# Patient Record
Sex: Female | Born: 1945 | Race: White | Hispanic: No | Marital: Married | Smoking: Never smoker
Health system: Southern US, Community
[De-identification: ages and names within clinical notes are randomized; demographics above are authoritative.]

## PROBLEM LIST (undated history)

## (undated) HISTORY — PX: PARTIAL HYSTERECTOMY: SHX80

---

## 2018-05-11 ENCOUNTER — Encounter (HOSPITAL_COMMUNITY): Payer: Self-pay

## 2018-05-11 ENCOUNTER — Inpatient Hospital Stay (HOSPITAL_COMMUNITY)
Admission: EM | Admit: 2018-05-11 | Discharge: 2018-05-17 | DRG: 871 | Disposition: A | Discharge status: Home Health | Payer: Medicare Other | Attending: Internal Medicine | Admitting: Internal Medicine

## 2018-05-11 ENCOUNTER — Emergency Department (HOSPITAL_COMMUNITY): Payer: Medicare Other

## 2018-05-11 ENCOUNTER — Other Ambulatory Visit: Payer: Self-pay

## 2018-05-11 DIAGNOSIS — B9620 Unspecified Escherichia coli [E. coli] as the cause of diseases classified elsewhere: Secondary | ICD-10-CM | POA: Diagnosis present

## 2018-05-11 DIAGNOSIS — Z90721 Acquired absence of ovaries, unilateral: Secondary | ICD-10-CM

## 2018-05-11 DIAGNOSIS — E876 Hypokalemia: Secondary | ICD-10-CM | POA: Diagnosis present

## 2018-05-11 DIAGNOSIS — R188 Other ascites: Secondary | ICD-10-CM

## 2018-05-11 DIAGNOSIS — A419 Sepsis, unspecified organism: Secondary | ICD-10-CM | POA: Diagnosis present

## 2018-05-11 DIAGNOSIS — Z9889 Other specified postprocedural states: Secondary | ICD-10-CM | POA: Diagnosis not present

## 2018-05-11 DIAGNOSIS — R6520 Severe sepsis without septic shock: Secondary | ICD-10-CM

## 2018-05-11 DIAGNOSIS — J9811 Atelectasis: Secondary | ICD-10-CM | POA: Diagnosis present

## 2018-05-11 DIAGNOSIS — K37 Unspecified appendicitis: Secondary | ICD-10-CM | POA: Diagnosis present

## 2018-05-11 DIAGNOSIS — K3580 Unspecified acute appendicitis: Secondary | ICD-10-CM

## 2018-05-11 DIAGNOSIS — E871 Hypo-osmolality and hyponatremia: Secondary | ICD-10-CM | POA: Diagnosis present

## 2018-05-11 DIAGNOSIS — I480 Paroxysmal atrial fibrillation: Secondary | ICD-10-CM | POA: Diagnosis present

## 2018-05-11 DIAGNOSIS — N179 Acute kidney failure, unspecified: Secondary | ICD-10-CM | POA: Diagnosis present

## 2018-05-11 DIAGNOSIS — I10 Essential (primary) hypertension: Secondary | ICD-10-CM | POA: Diagnosis present

## 2018-05-11 DIAGNOSIS — Z8249 Family history of ischemic heart disease and other diseases of the circulatory system: Secondary | ICD-10-CM

## 2018-05-11 DIAGNOSIS — I4891 Unspecified atrial fibrillation: Secondary | ICD-10-CM | POA: Diagnosis present

## 2018-05-11 DIAGNOSIS — Z90711 Acquired absence of uterus with remaining cervical stump: Secondary | ICD-10-CM | POA: Diagnosis not present

## 2018-05-11 DIAGNOSIS — E878 Other disorders of electrolyte and fluid balance, not elsewhere classified: Secondary | ICD-10-CM | POA: Diagnosis present

## 2018-05-11 DIAGNOSIS — K3532 Acute appendicitis with perforation and localized peritonitis, without abscess: Secondary | ICD-10-CM | POA: Diagnosis not present

## 2018-05-11 DIAGNOSIS — K5900 Constipation, unspecified: Secondary | ICD-10-CM | POA: Diagnosis present

## 2018-05-11 DIAGNOSIS — B966 Bacteroides fragilis [B. fragilis] as the cause of diseases classified elsewhere: Secondary | ICD-10-CM | POA: Diagnosis present

## 2018-05-11 DIAGNOSIS — E860 Dehydration: Secondary | ICD-10-CM | POA: Diagnosis present

## 2018-05-11 DIAGNOSIS — K651 Peritoneal abscess: Secondary | ICD-10-CM

## 2018-05-11 DIAGNOSIS — Z882 Allergy status to sulfonamides status: Secondary | ICD-10-CM | POA: Diagnosis not present

## 2018-05-11 DIAGNOSIS — I361 Nonrheumatic tricuspid (valve) insufficiency: Secondary | ICD-10-CM | POA: Diagnosis not present

## 2018-05-11 DIAGNOSIS — K3533 Acute appendicitis with perforation and localized peritonitis, with abscess: Secondary | ICD-10-CM | POA: Diagnosis present

## 2018-05-11 LAB — MAGNESIUM: Magnesium: 2.7 mg/dL — ABNORMAL HIGH (ref 1.7–2.4)

## 2018-05-11 LAB — LIPASE, BLOOD: Lipase: 36 U/L (ref 11–51)

## 2018-05-11 LAB — COMPREHENSIVE METABOLIC PANEL WITH GFR
ALT: 76 U/L — ABNORMAL HIGH (ref 0–44)
AST: 26 U/L (ref 15–41)
Albumin: 2.8 g/dL — ABNORMAL LOW (ref 3.5–5.0)
Alkaline Phosphatase: 238 U/L — ABNORMAL HIGH (ref 38–126)
Anion gap: 17 — ABNORMAL HIGH (ref 5–15)
BUN: 73 mg/dL — ABNORMAL HIGH (ref 8–23)
CO2: 22 mmol/L (ref 22–32)
Calcium: 8.6 mg/dL — ABNORMAL LOW (ref 8.9–10.3)
Chloride: 93 mmol/L — ABNORMAL LOW (ref 98–111)
Creatinine, Ser: 2.53 mg/dL — ABNORMAL HIGH (ref 0.44–1.00)
GFR calc Af Amer: 21 mL/min — ABNORMAL LOW
GFR calc non Af Amer: 18 mL/min — ABNORMAL LOW
Glucose, Bld: 115 mg/dL — ABNORMAL HIGH (ref 70–99)
Potassium: 3.3 mmol/L — ABNORMAL LOW (ref 3.5–5.1)
Sodium: 132 mmol/L — ABNORMAL LOW (ref 135–145)
Total Bilirubin: 0.9 mg/dL (ref 0.3–1.2)
Total Protein: 7.1 g/dL (ref 6.5–8.1)

## 2018-05-11 LAB — URINALYSIS, ROUTINE W REFLEX MICROSCOPIC
BILIRUBIN URINE: NEGATIVE
Glucose, UA: NEGATIVE mg/dL
Ketones, ur: NEGATIVE mg/dL
Nitrite: NEGATIVE
Protein, ur: 30 mg/dL — AB
RBC / HPF: >50 RBC/hpf — ABNORMAL HIGH (ref 0–5)
Specific Gravity, Urine: 1.017 (ref 1.005–1.030)
WBC, UA: >50 WBC/hpf — ABNORMAL HIGH (ref 0–5)
pH: 5 (ref 5.0–8.0)

## 2018-05-11 LAB — I-STAT CREATININE, ED: Creatinine, Ser: 2.7 mg/dL — ABNORMAL HIGH (ref 0.44–1.00)

## 2018-05-11 LAB — OSMOLALITY, URINE: Osmolality, Ur: 334 mOsm/kg (ref 300–900)

## 2018-05-11 LAB — SODIUM, URINE, RANDOM: Sodium, Ur: 12 mmol/L

## 2018-05-11 LAB — CBC
HCT: 40.1 % (ref 36.0–46.0)
Hemoglobin: 13 g/dL (ref 12.0–15.0)
MCH: 30.4 pg (ref 26.0–34.0)
MCHC: 32.4 g/dL (ref 30.0–36.0)
MCV: 93.7 fL (ref 80.0–100.0)
Platelets: 290 10*3/uL (ref 150–400)
RBC: 4.28 MIL/uL (ref 3.87–5.11)
RDW: 13.4 % (ref 11.5–15.5)
WBC: 27.8 10*3/uL — ABNORMAL HIGH (ref 4.0–10.5)
nRBC: 0 % (ref 0.0–0.2)

## 2018-05-11 LAB — LACTIC ACID, PLASMA: LACTIC ACID, VENOUS: 0.9 mmol/L (ref 0.5–1.9)

## 2018-05-11 LAB — CREATININE, URINE, RANDOM: Creatinine, Urine: 242.39 mg/dL

## 2018-05-11 MED ORDER — DILTIAZEM HCL 100 MG IV SOLR
5.0000 mg/h | INTRAVENOUS | Status: DC
  Administered 2018-05-11 – 2018-05-12 (×2): 5 mg/h via INTRAVENOUS
  Filled 2018-05-11 (×2): qty 100

## 2018-05-11 MED ORDER — PIPERACILLIN-TAZOBACTAM 3.375 G IVPB 30 MIN
3.3750 g | Freq: Once | INTRAVENOUS | Status: AC
  Administered 2018-05-11: 3.375 g via INTRAVENOUS
  Filled 2018-05-11: qty 50

## 2018-05-11 MED ORDER — SODIUM CHLORIDE 0.9 % IV BOLUS
1000.0000 mL | Freq: Once | INTRAVENOUS | Status: AC
  Administered 2018-05-11: 1000 mL via INTRAVENOUS

## 2018-05-11 MED ORDER — SODIUM CHLORIDE 0.9 % IV BOLUS
500.0000 mL | Freq: Once | INTRAVENOUS | Status: AC
  Administered 2018-05-11: 500 mL via INTRAVENOUS

## 2018-05-11 MED ORDER — MORPHINE SULFATE (PF) 4 MG/ML IV SOLN
4.0000 mg | Freq: Once | INTRAVENOUS | Status: AC
  Administered 2018-05-11: 4 mg via INTRAVENOUS
  Filled 2018-05-11: qty 1

## 2018-05-11 MED ORDER — DILTIAZEM LOAD VIA INFUSION
20.0000 mg | Freq: Once | INTRAVENOUS | Status: AC
  Administered 2018-05-11: 20 mg via INTRAVENOUS
  Filled 2018-05-11: qty 20

## 2018-05-11 MED ORDER — ONDANSETRON HCL 4 MG/2ML IJ SOLN
4.0000 mg | Freq: Once | INTRAMUSCULAR | Status: AC
  Administered 2018-05-11: 4 mg via INTRAVENOUS
  Filled 2018-05-11: qty 2

## 2018-05-11 MED ORDER — POTASSIUM CHLORIDE 10 MEQ/100ML IV SOLN
10.0000 meq | INTRAVENOUS | Status: AC
  Administered 2018-05-11 (×2): 10 meq via INTRAVENOUS
  Filled 2018-05-11 (×2): qty 100

## 2018-05-11 MED ORDER — SODIUM CHLORIDE 0.9% FLUSH
3.0000 mL | Freq: Once | INTRAVENOUS | Status: AC
  Administered 2018-05-16: 3 mL via INTRAVENOUS

## 2018-05-11 NOTE — ED Provider Notes (Signed)
[facility]-EMERGENCY DEPT Provider Note   CSN: [id number] Arrival date & time: 05/11/18  1624    History   Chief Complaint Chief Complaint  Patient presents with   Abdominal Pain   Constipation    HPI Monique Castillo is a 73 y.o. female.     73 yo F with a chief complaint of right lower quadrant abdominal pain.  Going on for about 5 or 6 days now.  No fevers.  Has been constipated with this.  A few episodes of vomiting but generally has been able to tolerate p.o.  Worse with ambulation.  Better is when she lies back flat.  Has a prior right-sided oophrectomy.  Denies urinary symptoms.  Denies flank pain.  Denies issues with eating.  The history is provided by the patient.  Abdominal Pain  Pain location:  RLQ Pain quality: sharp and shooting   Pain radiates to:  Does not radiate Pain severity:  Moderate Onset quality:  Gradual Duration:  5 days Timing:  Constant Progression:  Worsening Chronicity:  New Relieved by:  Nothing Worsened by:  Palpation (ambulation) Ineffective treatments:  None tried Associated symptoms: constipation, nausea and vomiting   Associated symptoms: no chest pain, no chills, no dysuria, no fever and no shortness of breath   Constipation  Associated symptoms: abdominal pain, nausea and vomiting   Associated symptoms: no dysuria and no fever     History reviewed. No pertinent past medical history.  Patient Active Problem List   Diagnosis Date Noted   Appendicitis 05/11/2018   Hyponatremia 05/11/2018   Hypokalemia 05/11/2018   Dehydration 05/11/2018   Sepsis (HCC) 05/11/2018   Intraabdominal fluid collection 05/11/2018   AKI (acute kidney injury) (HCC) 05/11/2018   Atrial fibrillation with RVR (HCC) 05/11/2018    Past Surgical History:  Procedure Laterality Date   PARTIAL HYSTERECTOMY       OB History   No obstetric history on file.      Home Medications    Prior to Admission medications   Medication  Sig Start Date End Date Taking? Authorizing Provider  ibuprofen (ADVIL,MOTRIN) 200 MG tablet Take 400 mg by mouth every 6 (six) hours as needed for moderate pain.   Yes [provider]    Family History Family History  Problem Relation Age of Onset   Seizures Father    Ulcers Father    Heart failure Father     Social History Social History   Tobacco Use   Smoking status: Never Smoker   Smokeless tobacco: Never Used  Substance Use Topics   Alcohol use: Yes    Comment: 1 glass wine daily   Drug use: Never     Allergies   Sulfa antibiotics   Review of Systems Review of Systems  Constitutional: Negative for chills and fever.  HENT: Negative for congestion and rhinorrhea.   Eyes: Negative for redness and visual disturbance.  Respiratory: Negative for shortness of breath and wheezing.   Cardiovascular: Negative for chest pain and palpitations.  Gastrointestinal: Positive for abdominal pain, constipation, nausea and vomiting.  Genitourinary: Negative for dysuria and urgency.  Musculoskeletal: Negative for arthralgias and myalgias.  Skin: Negative for pallor and wound.  Neurological: Negative for dizziness and headaches.     Physical Exam Updated Vital Signs BP 114/77    Pulse (!) 127    Temp 98.1 F (36.7 C) (Oral)    Resp 20    Ht  (1.676 m)    Wt 74.8 kg  SpO2 93%    BMI 26.63 kg/m   Physical Exam Vitals signs and nursing note reviewed.  Constitutional:      General: She is not in acute distress.    Appearance: She is well-developed. She is not diaphoretic.  HENT:     Head: Normocephalic and atraumatic.  Eyes:     Pupils: Pupils are equal, round, and reactive to light.  Neck:     Musculoskeletal: Normal range of motion and neck supple.  Cardiovascular:     Rate and Rhythm: Normal rate and regular rhythm.     Heart sounds: No murmur. No friction rub. No gallop.   Pulmonary:     Effort: Pulmonary effort is normal.     Breath sounds: No  wheezing or rales.  Abdominal:     General: There is no distension.     Palpations: Abdomen is soft.     Tenderness: There is abdominal tenderness ( Focally tender to the right lower quadrant.  Negative Rovsing's negative obturator negative psoas).  Musculoskeletal:        General: No tenderness.  Skin:    General: Skin is warm and dry.  Neurological:     Mental Status: She is alert and oriented to person, place, and time.  Psychiatric:        Behavior: Behavior normal.      ED Treatments / Results  Labs (all labs ordered are listed, but only abnormal results are displayed) Labs Reviewed  COMPREHENSIVE METABOLIC PANEL - Abnormal; Notable for the following components:      Result Value   Sodium 132 (*)    Potassium 3.3 (*)    Chloride 93 (*)    Glucose, Bld 115 (*)    BUN 73 (*)    Creatinine, Ser 2.53 (*)    Calcium 8.6 (*)    Albumin 2.8 (*)    ALT 76 (*)    Alkaline Phosphatase 238 (*)    GFR calc non Af Amer 18 (*)    GFR calc Af Amer 21 (*)    Anion gap 17 (*)    All other components within normal limits  CBC - Abnormal; Notable for the following components:   WBC 27.8 (*)    All other components within normal limits  URINALYSIS, ROUTINE W REFLEX MICROSCOPIC - Abnormal; Notable for the following components:   Color, Urine AMBER (*)    APPearance CLOUDY (*)    Hgb urine dipstick MODERATE (*)    Protein, ur 30 (*)    Leukocytes,Ua LARGE (*)    RBC / HPF >50 (*)    WBC, UA >50 (*)    Bacteria, UA FEW (*)    Non Squamous Epithelial 6-10 (*)    All other components within normal limits  MAGNESIUM - Abnormal; Notable for the following components:   Magnesium 2.7 (*)    All other components within normal limits  I-STAT CREATININE, ED - Abnormal; Notable for the following components:   Creatinine, Ser 2.70 (*)    All other components within normal limits  LIPASE, BLOOD  LACTIC ACID, PLASMA  LACTIC ACID, PLASMA  SODIUM, URINE, RANDOM  OSMOLALITY, URINE    CREATININE, URINE, RANDOM    EKG EKG Interpretation  Date/Time:  Wednesday May 11 2018 19:44:13 EDT Ventricular Rate:  138 PR Interval:    QRS Duration: 104 QT Interval:  285 QTC Calculation: 432 R Axis:   62 Text Interpretation:  Atrial fibrillation Nonspecific repol abnormality, diffuse leads No old tracing  to compare Confirmed by Melene Plan [phone]) on 05/11/2018 7:52:10 PM   Radiology Ct Abdomen Pelvis Wo Contrast  Result Date: 05/11/2018 CLINICAL DATA:  Right lower abdominal pain with constipation and emesis EXAM: CT ABDOMEN AND PELVIS WITHOUT CONTRAST TECHNIQUE: Multidetector CT imaging of the abdomen and pelvis was performed following the standard protocol without IV contrast. COMPARISON:  None. FINDINGS: Lower chest: Lung bases demonstrate atelectasis at the right middle lobe and right base. Mild bronchiectasis at the right middle lobe and right base. Trace pericardial effusion. Heart size upper normal. Hepatobiliary: Calcified granuloma. No calcified gallstone or biliary dilatation Pancreas: Unremarkable. No pancreatic ductal dilatation or surrounding inflammatory changes. Spleen: Calcified granuloma Adrenals/Urinary Tract: Adrenal glands are within normal limits. Dilated left extrarenal pelvis versus parapelvic cysts. No hydroureter. No ureteral stone. Bladder is normal Stomach/Bowel: Stomach is nonenlarged. No dilated small bowel. Enlarged appendix measuring up to 15 mm in size with multiple appendicoliths. Significant right lower quadrant inflammatory changes. Vascular/Lymphatic: Nonaneurysmal aorta. Moderate aortic atherosclerosis. No significantly enlarged lymph nodes Reproductive: Uterine calcification likely a fibroid. No adnexal mass Other: Small foci of extraluminal gas in the right lower quadrant. Gas and fluid collection inferior to the liver measures 7.4 x 5.2 x 3.4 cm. Contiguous with this is an additional right lower quadrant fluid collection measuring 5 x 3.1 x 9 cm.  Smaller fluid collections anterior to the transverse colon measuring up to 2.1 cm. Musculoskeletal: Degenerative changes. Grade 1 anterolisthesis L4 on L5. No acute osseous abnormality IMPRESSION: 1. Findings consistent with acute perforated appendicitis. Small extraluminal gas locules in the right lower quadrant. Multiple gas and fluid collections extending from the inferior right hepatic lobe to the right lower quadrant consistent with intra-abdominal abscess. Appendix: Location: Right lower quadrant Diameter: 15 mm Appendicolith: At least 3 appendicoliths Mucosal hyper-enhancement: No contrast given Extraluminal gas: Positive Periappendiceal collection: Positive Critical Value/emergent results were called by telephone at the time of interpretation on 05/11/2018 at 7:09 pm to Dr. Melene Plan , who verbally acknowledged these results. Electronically Signed   By: Jasmine Pang M.D.   On: 05/11/2018 19:09    Procedures Procedures (including critical care time)  Medications Ordered in ED Medications  sodium chloride flush (NS) 0.9 % injection 3 mL (3 mLs Intravenous Not Given 05/11/18 1910)  diltiazem (CARDIZEM) 1 mg/mL load via infusion 20 mg (20 mg Intravenous Bolus from Bag 05/11/18 2048)    And  diltiazem (CARDIZEM) 100 mg in dextrose 5 % 100 mL (1 mg/mL) infusion (5 mg/hr Intravenous New Bag/Given 05/11/18 2046)  potassium chloride 10 mEq in 100 mL IVPB (has no administration in time range)  sodium chloride 0.9 % bolus 1,000 mL (0 mLs Intravenous Stopped 05/11/18 2013)  ondansetron (ZOFRAN) injection 4 mg (4 mg Intravenous Given 05/11/18 1903)  morphine 4 MG/ML injection 4 mg (4 mg Intravenous Given 05/11/18 1903)  piperacillin-tazobactam (ZOSYN) IVPB 3.375 g (0 g Intravenous Stopped 05/11/18 2013)  sodium chloride 0.9 % bolus 1,000 mL (1,000 mLs Intravenous New Bag/Given (Non-Interop) 05/11/18 2044)     Initial Impression / Assessment and Plan / ED Course  I have reviewed the triage vital signs and  the nursing notes.  Pertinent labs & imaging results that were available during my care of the patient were reviewed by me and considered in my medical decision making (see chart for details).        73 yo F with a chief complaint of right lower quadrant abdominal pain.  This been going on for about 5  or 6 days now.  Is gotten worse over the past couple days.  Has had some constipation along with has had some vomiting as well.  No fevers or chills.  On exam she is focally tender to the right lower quadrant.  Will obtain a CT scan.  Her initial renal function showed a creatinine of 2.7 will obtain a scan without contrast.  Uremia with hyponatremia and hypochloremia.  She does have an anion gap which most likely is from her uremia but will add on a lactate.  CT scan concerning for perforated appendicitis with abscess formation.  Start on antibiotics.  Discussed with general surgery.  Discussed with Dr. Corliss Skains, he felt that the patient likely would get a drain tomorrow morning.  Stated that she was okay for clear liquids tonight n.p.o. at midnight and they will evaluate her in the morning.  With her undiagnosed renal dysfunction and then she just went into A. fib with RVR on reassessment I will discuss with the hospitalist for admission.  CRITICAL CARE Performed by: Rae Roam   Total critical care time: 35 minutes  Critical care time was exclusive of separately billable procedures and treating other patients.  Critical care was necessary to treat or prevent imminent or life-threatening deterioration.  Critical care was time spent personally by me on the following activities: development of treatment plan with patient and/or surrogate as well as nursing, discussions with consultants, evaluation of patient's response to treatment, examination of patient, obtaining history from patient or surrogate, ordering and performing treatments and interventions, ordering and review of laboratory  studies, ordering and review of radiographic studies, pulse oximetry and re-evaluation of patient's condition.  The patients results and plan were reviewed and discussed.   Any x-rays performed were independently reviewed by myself.   Differential diagnosis were considered with the presenting HPI.  Medications  sodium chloride flush (NS) 0.9 % injection 3 mL (3 mLs Intravenous Not Given 05/11/18 1910)  diltiazem (CARDIZEM) 1 mg/mL load via infusion 20 mg (20 mg Intravenous Bolus from Bag 05/11/18 2048)    And  diltiazem (CARDIZEM) 100 mg in dextrose 5 % 100 mL (1 mg/mL) infusion (5 mg/hr Intravenous New Bag/Given 05/11/18 2046)  potassium chloride 10 mEq in 100 mL IVPB (has no administration in time range)  sodium chloride 0.9 % bolus 1,000 mL (0 mLs Intravenous Stopped 05/11/18 2013)  ondansetron (ZOFRAN) injection 4 mg (4 mg Intravenous Given 05/11/18 1903)  morphine 4 MG/ML injection 4 mg (4 mg Intravenous Given 05/11/18 1903)  piperacillin-tazobactam (ZOSYN) IVPB 3.375 g (0 g Intravenous Stopped 05/11/18 2013)  sodium chloride 0.9 % bolus 1,000 mL (1,000 mLs Intravenous New Bag/Given (Non-Interop) 05/11/18 2044)    Vitals:   05/11/18 1633 05/11/18 1640 05/11/18 1920 05/11/18 2030  BP: 118/86  105/89 114/77  Pulse: 74  (!) 136 (!) 127  Resp: 17  18 20   Temp: 98.1 F (36.7 C)     TempSrc: Oral     SpO2: 96%  96% 93%  Weight:  74.8 kg    Height:  5\' 6"  (1.676 m)      Final diagnoses:  Perforated appendicitis  Atrial fibrillation with rapid ventricular response (HCC)    Admission/ observation were discussed with the admitting physician, patient and/or family and they are comfortable with the plan.    Final Clinical Impressions(s) / ED Diagnoses   Final diagnoses:  Perforated appendicitis  Atrial fibrillation with rapid ventricular response [facility])    ED Discharge Orders  None       Melene Plan, DO 05/11/18 2106

## 2018-05-11 NOTE — ED Triage Notes (Signed)
Patient c/o right lower abdominal pain, constipation, and emesis 2 days ago, but none since.

## 2018-05-11 NOTE — ED Notes (Signed)
Confirmed with pharmacy to bolus cardizem from bag

## 2018-05-11 NOTE — H&P (Signed)
Monique Castillo BXU:[id number] DOB: [birth year] DOA: 05/11/2018     PCP: Patient, [name]   Outpatient Specialists: * NONE CARDS: * Dr. NEphrology: *  Dr. NEurology *   Dr. Pulmonary *  Dr.  Oncology * Dr. Sandria Manly* Dr.  Deboraha Sprang, [facility]) Urology Dr. *  Patient arrived to ER on 05/11/18 at 1624  Patient coming from: home Lives ,     With family    Chief Complaint:  Chief Complaint  Patient presents with   Abdominal Pain   Constipation    HPI: Monique Castillo is a 73 y.o. female with medical history significant of oophorectomy many years ago    Presented with 1 week history of abdominal pain associated nausea and vomiting no fevers her abdominal pain has became worse since she presented to emergency department. She has been somewhat constipated  Regarding pertinent Chronic problems: No prior history of atrial fibrillation   While in ER: While in the ER developed A. fib with RVR up to 140 heart rate she has no prior history of A. Fib Given a dose of Cardizem and IV fluids  Discussed with surgery who plans to have IR consult and put a percutaneous drain tomorrow started on Zosyn  The following Work up has been ordered so far:  Orders Placed This Encounter  Procedures   CT ABDOMEN PELVIS WO CONTRAST   Lipase, blood   Comprehensive metabolic panel   CBC   Urinalysis, Routine w reflex microscopic   Lactic acid, plasma   Diet NPO time specified   Saline Lock IV, Maintain IV access   Consult to general surgery   Consult to hospitalist   I-Stat Creatinine, ED (not at [facility])   EKG 12-Lead      Following Medications were ordered in ER: Medications  sodium chloride flush (NS) 0.9 % injection 3 mL (3 mLs Intravenous Not Given 05/11/18 1910)  diltiazem (CARDIZEM) 1 mg/mL load via infusion 20 mg (has no administration in time range)    And  diltiazem (CARDIZEM) 100 mg in dextrose 5 % 100 mL (1 mg/mL) infusion (has no administration in time range)  sodium chloride 0.9 %  bolus 1,000 mL (has no administration in time range)  sodium chloride 0.9 % bolus 1,000 mL (1,000 mLs Intravenous New Bag/Given 05/11/18 1904)  ondansetron (ZOFRAN) injection 4 mg (4 mg Intravenous Given 05/11/18 1903)  morphine 4 MG/ML injection 4 mg (4 mg Intravenous Given 05/11/18 1903)  piperacillin-tazobactam (ZOSYN) IVPB 3.375 g (3.375 g Intravenous New Bag/Given 05/11/18 1929)        Consult Orders  (From admission, onward)         Start     Ordered   05/11/18 1946  Consult to hospitalist  Once    Provider:  Therisa Doyne, MD  Question Answer Comment  Place call to: [facility]   Reason for Consult Admit      05/11/18 1945          ER Provider Called:     Dr.Tsuei  They Recommend admit to medicine liquids for tonight n.p.o. postmidnight for possible IR procedure Will see in  in ER   Significant initial  Findings: Abnormal Labs Reviewed  COMPREHENSIVE METABOLIC PANEL - Abnormal; Notable for the following components:      Result Value   Sodium 132 (*)    Potassium 3.3 (*)    Chloride 93 (*)    Glucose, Bld 115 (*)    BUN 73 (*)  Creatinine, Ser 2.53 (*)    Calcium 8.6 (*)    Albumin 2.8 (*)    ALT 76 (*)    Alkaline Phosphatase 238 (*)    GFR calc non Af Amer 18 (*)    GFR calc Af Amer 21 (*)    Anion gap 17 (*)    All other components within normal limits  CBC - Abnormal; Notable for the following components:   WBC 27.8 (*)    All other components within normal limits  URINALYSIS, ROUTINE W REFLEX MICROSCOPIC - Abnormal; Notable for the following components:   Color, Urine AMBER (*)    APPearance CLOUDY (*)    Hgb urine dipstick MODERATE (*)    Protein, ur 30 (*)    Leukocytes,Ua LARGE (*)    RBC / HPF >50 (*)    WBC, UA >50 (*)    Bacteria, UA FEW (*)    Non Squamous Epithelial 6-10 (*)    All other components within normal limits  I-STAT CREATININE, ED - Abnormal; Notable for the following components:   Creatinine, Ser 2.70 (*)     All other components within normal limits     Otherwise labs showing:    Recent Labs  Lab 05/11/18 1646 05/11/18 1723  NA 132*  --   K 3.3*  --   CO2 22  --   GLUCOSE 115*  --   BUN 73*  --   CREATININE 2.53* 2.70*  CALCIUM 8.6*  --     Cr  Unknown baseline Lab Results  Component Value Date   CREATININE 2.70 (H) 05/11/2018   CREATININE 2.53 (H) 05/11/2018    Recent Labs  Lab 05/11/18 1646  AST 26  ALT 76*  ALKPHOS 238*  BILITOT 0.9  PROT 7.1  ALBUMIN 2.8*       WBC       Component Value Date/Time   WBC 27.8 (H) 05/11/2018 1646    Lactic Acid, Venous    Component Value Date/Time   LATICACIDVEN 0.9 05/11/2018 2017      HG/HCT  Stable,     Component Value Date/Time   HGB 13.0 05/11/2018 1646   HCT 40.1 05/11/2018 1646    Recent Labs  Lab 05/11/18 1646  LIPASE 36   No results for input(s): AMMONIA in the last 168 hours.    Troponin (Point of Care Test) No results for input(s): TROPIPOC in the last 72 hours.     BNP (last 3 results) No results for input(s): BNP in the last 8760 hours.  ProBNP (last 3 results) No results for input(s): PROBNP in the last 8760 hours.  DM  labs:  HbA1C: No results for input(s): HGBA1C in the last 72 hours. CBG: No results for input(s): GLUCAP in the last 168 hours.    UA no bacteria both large white blood cells and red blood cells Urine analysis:    Component Value Date/Time   COLORURINE AMBER (A) 05/11/2018 1646   APPEARANCEUR CLOUDY (A) 05/11/2018 1646   LABSPEC 1.017 05/11/2018 1646   PHURINE 5.0 05/11/2018 1646   GLUCOSEU NEGATIVE 05/11/2018 1646   HGBUR MODERATE (A) 05/11/2018 1646   BILIRUBINUR NEGATIVE 05/11/2018 1646   KETONESUR NEGATIVE 05/11/2018 1646   PROTEINUR 30 (A) 05/11/2018 1646   NITRITE NEGATIVE 05/11/2018 1646   LEUKOCYTESUR LARGE (A) 05/11/2018 1646     CTabd/pelvis - acute perforated appendicitis. intra-abdominal abscess    ECG:  Personally reviewed by me  showing: HR : 132 Rhythm A.fib.  W RVR,    no evidence of ischemic changes QTC 438      ED Triage Vitals  Enc Vitals Group     BP 05/11/18 1633 118/86     Pulse Rate 05/11/18 1633 74     Resp 05/11/18 1633 17     Temp 05/11/18 1633 98.1 F (36.7 C)     Temp Source 05/11/18 1633 Oral     SpO2 05/11/18 1633 96 %     Weight 05/11/18 1640 165 lb (74.8 kg)     Height 05/11/18 1640 5\' 6"  (1.676 m)     Head Circumference --      Peak Flow --      Pain Score 05/11/18 1639 9     Pain Loc --      Pain Edu? --      Excl. in GC? --   TMAX(24)@       Latest  Blood pressure 105/89, pulse (!) 136, temperature 98.1 F (36.7 C), temperature source Oral, resp. rate 18, height 5\' 6"  (1.676 m), weight 74.8 kg, SpO2 96 %.     Hospitalist was called for admission for perforated appendicitis with intra-abdominal fluid collection likely abscess and new onset of A. fib with RVR   Review of Systems:    Pertinent positives include: abdominal pain, nausea, vomiting  Constitutional:  No weight loss, night sweats, Fevers, chills, fatigue, weight loss  HEENT:  No headaches, Difficulty swallowing,Tooth/dental problems,Sore throat,  No sneezing, itching, ear ache, nasal congestion, post nasal drip,  Cardio-vascular:  No chest pain, Orthopnea, PND, anasarca, dizziness, palpitations.no Bilateral lower extremity swelling  GI:  No heartburn, indigestion, , diarrhea, change in bowel habits, loss of appetite, melena, blood in stool, hematemesis Resp:  no shortness of breath at rest. No dyspnea on exertion, No excess mucus, no productive cough, No non-productive cough, No coughing up of blood.No change in color of mucus.No wheezing. Skin:  no rash or lesions. No jaundice GU:  no dysuria, change in color of urine, no urgency or frequency. No straining to urinate.  No flank pain.  Musculoskeletal:  No joint pain or no joint swelling. No decreased range of motion. No back pain.  Psych:  No change in  mood or affect. No depression or anxiety. No memory loss.  Neuro: no localizing neurological complaints, no tingling, no weakness, no double vision, no gait abnormality, no slurred speech, no confusion  All systems reviewed and apart from HOPI all are negative  Past Medical History:  History reviewed. No pertinent past medical history.    Past Surgical History:  Procedure Laterality Date   PARTIAL HYSTERECTOMY      Social History:  Ambulatory   Independently    reports that she has never smoked. She has never used smokeless tobacco. She reports current alcohol use. She reports that she does not use drugs.     Family History:   Family History  Problem Relation Age of Onset   Seizures Father    Ulcers Father    Heart failure Father     Allergies: Allergies  Allergen Reactions   Sulfa Antibiotics      Prior to Admission medications   Medication Sig Start Date End Date Taking? Authorizing Provider  ibuprofen (ADVIL,MOTRIN) 200 MG tablet Take 400 mg by mouth every 6 (six) hours as needed for moderate pain.   Yes [provider]   Physical Exam: Blood pressure 105/89, pulse (!) 136, temperature 98.1 F (36.7 C), temperature source Oral, resp.  rate 18, height 5\' 6"  (1.676 m), weight 74.8 kg, SpO2 96 %. 1. General:  in No Acute distress   well  -appearing 2. Psychological: Alert and   Oriented 3. Head/ENT:    Dry Mucous Membranes                          Head Non traumatic, neck supple                       Poor Dentition 4. SKIN:  decreased Skin turgor,  Skin clean Dry and intact no rash 5. Heart: Regular rate and rhythm no  Murmur, no Rub or gallop 6. Lungs:   no wheezes or crackles   7. Abdomen: Soft, RLQ tender, Non distended  bowel sounds present 8. Lower extremities: no clubbing, cyanosis, no trace  9. Neurologically Grossly intact, moving all 4 extremities equally   10. MSK: Normal range of motion   All other LABS:     Recent Labs  Lab  05/11/18 1646  WBC 27.8*  HGB 13.0  HCT 40.1  MCV 93.7  PLT 290     Recent Labs  Lab 05/11/18 1646 05/11/18 1723  NA 132*  --   K 3.3*  --   CL 93*  --   CO2 22  --   GLUCOSE 115*  --   BUN 73*  --   CREATININE 2.53* 2.70*  CALCIUM 8.6*  --      Recent Labs  Lab 05/11/18 1646  AST 26  ALT 76*  ALKPHOS 238*  BILITOT 0.9  PROT 7.1  ALBUMIN 2.8*       Cultures: No results found for: SDES, SPECREQUEST, CULT, REPTSTATUS   Radiological Exams on Admission: Ct Abdomen Pelvis Wo Contrast  Result Date: 05/11/2018 CLINICAL DATA:  Right lower abdominal pain with constipation and emesis EXAM: CT ABDOMEN AND PELVIS WITHOUT CONTRAST TECHNIQUE: Multidetector CT imaging of the abdomen and pelvis was performed following the standard protocol without IV contrast. COMPARISON:  None. FINDINGS: Lower chest: Lung bases demonstrate atelectasis at the right middle lobe and right base. Mild bronchiectasis at the right middle lobe and right base. Trace pericardial effusion. Heart size upper normal. Hepatobiliary: Calcified granuloma. No calcified gallstone or biliary dilatation Pancreas: Unremarkable. No pancreatic ductal dilatation or surrounding inflammatory changes. Spleen: Calcified granuloma Adrenals/Urinary Tract: Adrenal glands are within normal limits. Dilated left extrarenal pelvis versus parapelvic cysts. No hydroureter. No ureteral stone. Bladder is normal Stomach/Bowel: Stomach is nonenlarged. No dilated small bowel. Enlarged appendix measuring up to 15 mm in size with multiple appendicoliths. Significant right lower quadrant inflammatory changes. Vascular/Lymphatic: Nonaneurysmal aorta. Moderate aortic atherosclerosis. No significantly enlarged lymph nodes Reproductive: Uterine calcification likely a fibroid. No adnexal mass Other: Small foci of extraluminal gas in the right lower quadrant. Gas and fluid collection inferior to the liver measures 7.4 x 5.2 x 3.4 cm. Contiguous with this is  an additional right lower quadrant fluid collection measuring 5 x 3.1 x 9 cm. Smaller fluid collections anterior to the transverse colon measuring up to 2.1 cm. Musculoskeletal: Degenerative changes. Grade 1 anterolisthesis L4 on L5. No acute osseous abnormality IMPRESSION: 1. Findings consistent with acute perforated appendicitis. Small extraluminal gas locules in the right lower quadrant. Multiple gas and fluid collections extending from the inferior right hepatic lobe to the right lower quadrant consistent with intra-abdominal abscess. Appendix: Location: Right lower quadrant Diameter: 15 mm Appendicolith: At least 3 appendicoliths Mucosal  hyper-enhancement: No contrast given Extraluminal gas: Positive Periappendiceal collection: Positive Critical Value/emergent results were called by telephone at the time of interpretation on 05/11/2018 at 7:09 pm to Dr. Melene Plan , who verbally acknowledged these results. Electronically Signed   By: Jasmine Pang M.D.   On: 05/11/2018 19:09    Chart has been reviewed    Assessment/Plan   73 y.o. female with medical history significant of oophorectomy many years ago Admitted for  Perforated enthesitis with fluid collection and new onset A. fib with RVR  Present on Admission:  Appendicitis -General surgery is aware clear liquids for tonight and n.p.o. post midnight likely will need IR placed drain for tomorrow IR consulted, for now continue Zosyn   Hyponatremia -in the setting of dehydration  Hypokalemia -will replace check magnesium level  Dehydration rehydrate and follow fluid status  Sepsis (HCC) -   -Patient meets sepsis criteria with leukocytosis   Tachycardia    Atrial fibrillation with RVR (HCC) -  - Admit to step down on Cardizem drip       CHA2D-VASC score 2 Hold off on anticoagulation for tonight given upcoming need for procedures and ongoing fluid collection in abdomen with perforation. Avoid amiodarone if and not anticoagulated Rehydrate  and continue Cardizem drip discussed with cardiology can attempt to use low-dose metoprolol such as 2.5 mg IV as needed but so far heart rate in 100 teens will continue to monitor           Check TSH      Cycle cardiac enzymes      Obtain ECHO      Cardiology consult in AM   Initial lactic acid Lactic Acid, Venous    Component Value Date/Time   LATICACIDVEN 0.9 05/11/2018 2017   Source most likely: intraAbdominal infection-We will rehydrate, treat with IV antibiotics, follow lactic acid - Await results of blood and urine culture and adjust antibiotics as needed     Intraabdominal fluid collection management as  per general surgery and IR plan to keep n.p.o. post midnight for possible drain placement continue antibiotics  AKI (acute kidney injury) (HCC) FENa is 0.1% distal with prerenal we will rehydrate and continue to follow creatinine     Other plan as per orders.  DVT prophylaxis:  SCD   Code Status:  FULL CODE   as per patient   I had personally discussed CODE STATUS with patient   Family Communication:   Family  at  Bedside  plan of care was discussed with    Husband,   Disposition Plan:   To home once workup is complete and patient is stable                                        Consults called: general surgery, emailed Cardiology   Admission status:  ED Disposition    ED Disposition Condition Comment   Admit  Hospital Area: [facility] [[id number]]  Level of Care: Stepdown [14]  Admit to SDU based on following criteria: Hemodynamic compromise or significant risk of instability:  Patient requiring short term acute titration and management of vasoactive drips, and invasive monitoring (i.e., CVP and Arterial line).  Diagnosis: Appendicitis [[id number]]  Admitting Physician: Therisa Doyne [[id number]]  Attending Physician: Therisa Doyne [[id number]]  Estimated length of stay: 3 - 4 days  Certification:: I certify this patient will need inpatient  services for  at least 2 midnights  PT Class (Do Not Modify): Inpatient [101]  PT Acc Code (Do Not Modify): Private [1]          inpatient     Expect 2 midnight stay secondary to severity of patient's current illness including   hemodynamic instability despite optimal treatment (tachycardia  hypotension )   Severe lab/radiological/exam abnormalities including: Dense of appendicitis with perforation and fluid collection     That are currently affecting medical management.   I expect  patient to be hospitalized for 2 midnights requiring inpatient medical care.  Patient is at high risk for adverse outcome (such as loss of life or disability) if not treated.  Indication for inpatient stay as follows:    Hemodynamic instability despite maximal medical therapy,     severe pain requiring acute inpatient management,  inability to maintain oral hydration    Need for operative/procedural  intervention    Need for IV antibiotics, IV fluids       Level of care      SDU tele indefinitely please discontinue once patient no longer qualifies  Precautions: NONE  No active isolations       Joelle Flessner 05/11/2018, 10:27 PM    [facility]     after 2 AM please page floor coverage PA If 7AM-7PM, please contact the day team taking care of the patient using [email or web address]

## 2018-05-11 NOTE — Consult Note (Signed)
Reason for Consult:Acute appendicitis Referring Physician: Dr. Marnette Burgessan Floyd  Monique Castillo is an 73 y.o. female.  HPI: This is a 73 year old female with renal insufficiency and new onset atrial fibrillation with RVR who presents with almost a week of right lower quadrant abdominal pain associated with nausea and vomiting.  She reports constipation.  Afebrile.  The tenderness is becoming worse, especially with ambulation.  She presented to the ED for evaluation and was found to be in atrial fibrillation with RVR.  Her creatinine is also elevated.  History reviewed. No pertinent past medical history.  Past Surgical History:  Procedure Laterality Date  . PARTIAL HYSTERECTOMY    R oophorectomy  Family History  Problem Relation Age of Onset  . Seizures Father   . Ulcers Father   . Heart failure Father     Social History:  reports that she has never smoked. She has never used smokeless tobacco. She reports current alcohol use. She reports that she does not use drugs.  Allergies:  Allergies  Allergen Reactions  . Sulfa Antibiotics     Medications:  Prior to Admission medications   Medication Sig Start Date End Date Taking? Authorizing Provider  ibuprofen (ADVIL,MOTRIN) 200 MG tablet Take 400 mg by mouth every 6 (six) hours as needed for moderate pain.   Yes [provider]     Results for orders placed or performed during the hospital encounter of 05/11/18 (from the past 48 hour(s))  Lipase, blood     Status: None   Collection Time: 05/11/18  4:46 PM  Result Value Ref Range   Lipase 36 11 - 51 U/L    Comment: Performed at [facility], [address] [place], [place] [address]  Comprehensive metabolic panel     Status: Abnormal   Collection Time: 05/11/18  4:46 PM  Result Value Ref Range   Sodium 132 (L) 135 - 145 mmol/L   Potassium 3.3 (L) 3.5 - 5.1 mmol/L   Chloride 93 (L) 98 - 111 mmol/L   CO2 22 22 - 32 mmol/L   Glucose, Bld 115 (H) 70 - 99 mg/dL    BUN 73 (H) 8 - 23 mg/dL   Creatinine, Ser 6.042.53 (H) 0.44 - 1.00 mg/dL   Calcium 8.6 (L) 8.9 - 10.3 mg/dL   Total Protein 7.1 6.5 - 8.1 g/dL   Albumin 2.8 (L) 3.5 - 5.0 g/dL   AST 26 15 - 41 U/L   ALT 76 (H) 0 - 44 U/L   Alkaline Phosphatase 238 (H) 38 - 126 U/L   Total Bilirubin 0.9 0.3 - 1.2 mg/dL   GFR calc non Af Amer 18 (L) >60 mL/min   GFR calc Af Amer 21 (L) >60 mL/min   Anion gap 17 (H) 5 - 15    Comment: Performed at [facility], [address] [place], [place] [address]  CBC     Status: Abnormal   Collection Time: 05/11/18  4:46 PM  Result Value Ref Range   WBC 27.8 (H) 4.0 - 10.5 K/uL   RBC 4.28 3.87 - 5.11 MIL/uL   Hemoglobin 13.0 12.0 - 15.0 g/dL   HCT 11.940.1 14.736.0 - 82.946.0 %   MCV 93.7 80.0 - 100.0 fL   MCH 30.4 26.0 - 34.0 pg   MCHC 32.4 30.0 - 36.0 g/dL   RDW 56.213.4 13.011.5 - 86.515.5 %   Platelets 290 150 - 400 K/uL   nRBC 0.0 0.0 - 0.2 %  Comment: Performed at [facility], [address], [place], [place] [address]  Urinalysis, Routine w reflex microscopic     Status: Abnormal   Collection Time: 05/11/18  4:46 PM  Result Value Ref Range   Color, Urine AMBER (A) YELLOW    Comment: BIOCHEMICALS MAY BE AFFECTED BY COLOR   APPearance CLOUDY (A) CLEAR   Specific Gravity, Urine 1.017 1.005 - 1.030   pH 5.0 5.0 - 8.0   Glucose, UA NEGATIVE NEGATIVE mg/dL   Hgb urine dipstick MODERATE (A) NEGATIVE   Bilirubin Urine NEGATIVE NEGATIVE   Ketones, ur NEGATIVE NEGATIVE mg/dL   Protein, ur 30 (A) NEGATIVE mg/dL   Nitrite NEGATIVE NEGATIVE   Leukocytes,Ua LARGE (A) NEGATIVE   RBC / HPF >50 (H) 0 - 5 RBC/hpf   WBC, UA >50 (H) 0 - 5 WBC/hpf   Bacteria, UA FEW (A) NONE SEEN   Squamous Epithelial / LPF 6-10 0 - 5   WBC Clumps PRESENT    Mucus PRESENT    Hyaline Casts, UA PRESENT    Amorphous Crystal PRESENT    Non Squamous Epithelial 6-10 (A) NONE SEEN    Comment: Performed at [facility], [address]  [place], [place] [address]  I-Stat Creatinine, ED (not at [facility])     Status: Abnormal   Collection Time: 05/11/18  5:23 PM  Result Value Ref Range   Creatinine, Ser 2.70 (H) 0.44 - 1.00 mg/dL    Ct Abdomen Pelvis Wo Contrast  Result Date: 05/11/2018 CLINICAL DATA:  Right lower abdominal pain with constipation and emesis EXAM: CT ABDOMEN AND PELVIS WITHOUT CONTRAST TECHNIQUE: Multidetector CT imaging of the abdomen and pelvis was performed following the standard protocol without IV contrast. COMPARISON:  None. FINDINGS: Lower chest: Lung bases demonstrate atelectasis at the right middle lobe and right base. Mild bronchiectasis at the right middle lobe and right base. Trace pericardial effusion. Heart size upper normal. Hepatobiliary: Calcified granuloma. No calcified gallstone or biliary dilatation Pancreas: Unremarkable. No pancreatic ductal dilatation or surrounding inflammatory changes. Spleen: Calcified granuloma Adrenals/Urinary Tract: Adrenal glands are within normal limits. Dilated left extrarenal pelvis versus parapelvic cysts. No hydroureter. No ureteral stone. Bladder is normal Stomach/Bowel: Stomach is nonenlarged. No dilated small bowel. Enlarged appendix measuring up to 15 mm in size with multiple appendicoliths. Significant right lower quadrant inflammatory changes. Vascular/Lymphatic: Nonaneurysmal aorta. Moderate aortic atherosclerosis. No significantly enlarged lymph nodes Reproductive: Uterine calcification likely a fibroid. No adnexal mass Other: Small foci of extraluminal gas in the right lower quadrant. Gas and fluid collection inferior to the liver measures 7.4 x 5.2 x 3.4 cm. Contiguous with this is an additional right lower quadrant fluid collection measuring 5 x 3.1 x 9 cm. Smaller fluid collections anterior to the transverse colon measuring up to 2.1 cm. Musculoskeletal: Degenerative changes. Grade 1 anterolisthesis L4 on L5. No acute osseous abnormality IMPRESSION: 1. Findings consistent  with acute perforated appendicitis. Small extraluminal gas locules in the right lower quadrant. Multiple gas and fluid collections extending from the inferior right hepatic lobe to the right lower quadrant consistent with intra-abdominal abscess. Appendix: Location: Right lower quadrant Diameter: 15 mm Appendicolith: At least 3 appendicoliths Mucosal hyper-enhancement: No contrast given Extraluminal gas: Positive Periappendiceal collection: Positive Critical Value/emergent results were called by telephone at the time of interpretation on 05/11/2018 at 7:09 pm to Dr. Melene Plan , who verbally acknowledged these results. Electronically Signed   By: Jasmine Pang M.D.   On: 05/11/2018 19:09  Review of Systems  Constitutional: Negative for weight loss.  HENT: Negative for ear discharge, ear pain, hearing loss and tinnitus.   Eyes: Negative for blurred vision, double vision, photophobia and pain.  Respiratory: Negative for cough, sputum production and shortness of breath.   Cardiovascular: Negative for chest pain.  Gastrointestinal: Positive for abdominal pain, constipation, nausea and vomiting.  Genitourinary: Negative for dysuria, flank pain, frequency and urgency.  Musculoskeletal: Negative for back pain, falls, joint pain, myalgias and neck pain.  Neurological: Negative for dizziness, tingling, sensory change, focal weakness, loss of consciousness and headaches.  Endo/Heme/Allergies: Does not bruise/bleed easily.  Psychiatric/Behavioral: Negative for depression, memory loss and substance abuse. The patient is not nervous/anxious.    Blood pressure 105/89, pulse (!) 136, temperature 98.1 F (36.7 C), temperature source Oral, resp. rate 18, height 5\' 6"  (1.676 m), weight 74.8 kg, SpO2 96 %. Physical Exam WDWN in NAD Eyes:  Pupils equal, round; sclera anicteric HENT:  Oral mucosa moist; good dentition  Neck:  No masses palpated, no thyromegaly Lungs:  CTA bilaterally; normal respiratory  effort CV:  Regular rate and rhythm; no murmurs; extremities well-perfused with no edema Abd:  Hypoactive bowel sounds, soft, tender in RLQ, no palpable organomegaly; no palpable hernias Skin:  Warm, dry; no sign of jaundice Psychiatric - alert and oriented x 4; calm mood and affect  Assessment/Plan: Acute perforated appendicitis with two, possibly contiguous, large intra-abdominal abscesses and other smaller collections. Atrial fibrillation with RVR  Recs: Admit to [facility] - may need cardiology consult IV hydration/ IV antibiotics (Zosyn) Will need CT-guided percutaneous drains placed in the two largest collections.  We will speak with IR tomorrow morning. She may have clears tonight, but would make her NPO after midnight.    Wilmon Arms Kazi Montoro 05/11/2018, 7:49 PM

## 2018-05-12 ENCOUNTER — Encounter (HOSPITAL_COMMUNITY): Payer: Self-pay | Admitting: Radiology

## 2018-05-12 ENCOUNTER — Inpatient Hospital Stay (HOSPITAL_COMMUNITY): Payer: Medicare Other

## 2018-05-12 DIAGNOSIS — I361 Nonrheumatic tricuspid (valve) insufficiency: Secondary | ICD-10-CM

## 2018-05-12 LAB — CBC
HCT: 33.5 % — ABNORMAL LOW (ref 36.0–46.0)
Hemoglobin: 11.1 g/dL — ABNORMAL LOW (ref 12.0–15.0)
MCH: 30.7 pg (ref 26.0–34.0)
MCHC: 33.1 g/dL (ref 30.0–36.0)
MCV: 92.5 fL (ref 80.0–100.0)
PLATELETS: 266 10*3/uL (ref 150–400)
RBC: 3.62 MIL/uL — ABNORMAL LOW (ref 3.87–5.11)
RDW: 13.8 % (ref 11.5–15.5)
WBC: 26.5 10*3/uL — ABNORMAL HIGH (ref 4.0–10.5)
nRBC: 0 % (ref 0.0–0.2)

## 2018-05-12 LAB — SURGICAL PCR SCREEN
MRSA, PCR: NEGATIVE
Staphylococcus aureus: NEGATIVE

## 2018-05-12 LAB — MAGNESIUM: Magnesium: 2.4 mg/dL (ref 1.7–2.4)

## 2018-05-12 LAB — PHOSPHORUS: Phosphorus: 3.6 mg/dL (ref 2.5–4.6)

## 2018-05-12 LAB — COMPREHENSIVE METABOLIC PANEL
ALT: 63 U/L — ABNORMAL HIGH (ref 0–44)
AST: 13 U/L — ABNORMAL LOW (ref 15–41)
Albumin: 2.2 g/dL — ABNORMAL LOW (ref 3.5–5.0)
Alkaline Phosphatase: 197 U/L — ABNORMAL HIGH (ref 38–126)
Anion gap: 13 (ref 5–15)
BUN: 64 mg/dL — ABNORMAL HIGH (ref 8–23)
CHLORIDE: 102 mmol/L (ref 98–111)
CO2: 20 mmol/L — ABNORMAL LOW (ref 22–32)
Calcium: 7.5 mg/dL — ABNORMAL LOW (ref 8.9–10.3)
Creatinine, Ser: 1.95 mg/dL — ABNORMAL HIGH (ref 0.44–1.00)
GFR calc Af Amer: 29 mL/min — ABNORMAL LOW (ref >60)
GFR calc non Af Amer: 25 mL/min — ABNORMAL LOW (ref >60)
Glucose, Bld: 104 mg/dL — ABNORMAL HIGH (ref 70–99)
Potassium: 3.1 mmol/L — ABNORMAL LOW (ref 3.5–5.1)
Sodium: 135 mmol/L (ref 135–145)
Total Bilirubin: 0.5 mg/dL (ref 0.3–1.2)
Total Protein: 6 g/dL — ABNORMAL LOW (ref 6.5–8.1)

## 2018-05-12 LAB — ECHOCARDIOGRAM COMPLETE
HEIGHTINCHES: 66 in
Weight: 2640 oz

## 2018-05-12 LAB — PROTIME-INR
INR: 1.1 (ref 0.8–1.2)
Prothrombin Time: 14.5 seconds (ref 11.4–15.2)

## 2018-05-12 LAB — PROCALCITONIN: Procalcitonin: 7.35 ng/mL

## 2018-05-12 LAB — LACTIC ACID, PLASMA: Lactic Acid, Venous: 0.7 mmol/L (ref 0.5–1.9)

## 2018-05-12 LAB — MRSA PCR SCREENING: MRSA by PCR: NEGATIVE

## 2018-05-12 LAB — TSH: TSH: 3.263 u[IU]/mL (ref 0.350–4.500)

## 2018-05-12 LAB — APTT: aPTT: 26 seconds (ref 24–36)

## 2018-05-12 MED ORDER — SODIUM CHLORIDE 0.9 % IV SOLN
INTRAVENOUS | Status: AC
  Administered 2018-05-12: 07:00:00 via INTRAVENOUS

## 2018-05-12 MED ORDER — ACETAMINOPHEN 650 MG RE SUPP: 650.0000 mg | Freq: Four times a day (QID) | RECTAL | Status: DC | PRN

## 2018-05-12 MED ORDER — PIPERACILLIN-TAZOBACTAM 3.375 G IVPB
3.3750 g | Freq: Three times a day (TID) | INTRAVENOUS | Status: DC
  Administered 2018-05-12 – 2018-05-14 (×7): 3.375 g via INTRAVENOUS
  Filled 2018-05-12 (×8): qty 50

## 2018-05-12 MED ORDER — SODIUM CHLORIDE 0.9 % IV SOLN
INTRAVENOUS | Status: AC
  Filled 2018-05-12: qty 250

## 2018-05-12 MED ORDER — MIDAZOLAM HCL 2 MG/2ML IJ SOLN
INTRAMUSCULAR | Status: AC
  Administered 2018-05-12: 18:00:00
  Filled 2018-05-12: qty 4

## 2018-05-12 MED ORDER — FENTANYL CITRATE (PF) 100 MCG/2ML IJ SOLN
INTRAMUSCULAR | Status: AC
  Administered 2018-05-12: 18:00:00
  Filled 2018-05-12: qty 4

## 2018-05-12 MED ORDER — ONDANSETRON HCL 4 MG/2ML IJ SOLN
4.0000 mg | Freq: Four times a day (QID) | INTRAMUSCULAR | Status: DC | PRN
  Administered 2018-05-13: 4 mg via INTRAVENOUS
  Filled 2018-05-12: qty 2

## 2018-05-12 MED ORDER — DILTIAZEM HCL 30 MG PO TABS
30.0000 mg | ORAL_TABLET | Freq: Four times a day (QID) | ORAL | Status: DC
  Administered 2018-05-12 – 2018-05-13 (×4): 30 mg via ORAL
  Filled 2018-05-12 (×5): qty 1

## 2018-05-12 MED ORDER — PIPERACILLIN-TAZOBACTAM 3.375 G IVPB 30 MIN: 3.3750 g | Freq: Three times a day (TID) | INTRAVENOUS | Status: DC

## 2018-05-12 MED ORDER — PIPERACILLIN-TAZOBACTAM IN DEX 2-0.25 GM/50ML IV SOLN
2.2500 g | Freq: Three times a day (TID) | INTRAVENOUS | Status: DC
  Administered 2018-05-12: 2.25 g via INTRAVENOUS
  Filled 2018-05-12 (×2): qty 50

## 2018-05-12 MED ORDER — NALOXONE HCL 0.4 MG/ML IJ SOLN
INTRAMUSCULAR | Status: AC
  Filled 2018-05-12: qty 1

## 2018-05-12 MED ORDER — FENTANYL CITRATE (PF) 100 MCG/2ML IJ SOLN
INTRAMUSCULAR | Status: AC | PRN
  Administered 2018-05-12: 50 ug via INTRAVENOUS

## 2018-05-12 MED ORDER — SODIUM CHLORIDE 0.9 % IV SOLN
INTRAVENOUS | Status: DC | PRN
  Administered 2018-05-12: 500 mL via INTRAVENOUS

## 2018-05-12 MED ORDER — FLUMAZENIL 0.5 MG/5ML IV SOLN
INTRAVENOUS | Status: AC
  Filled 2018-05-12: qty 5

## 2018-05-12 MED ORDER — SODIUM CHLORIDE 0.9 % IV SOLN
INTRAVENOUS | Status: DC
  Administered 2018-05-12: 21:00:00 via INTRAVENOUS

## 2018-05-12 MED ORDER — SODIUM CHLORIDE 0.9% FLUSH
5.0000 mL | Freq: Three times a day (TID) | INTRAVENOUS | Status: DC
  Administered 2018-05-12 – 2018-05-16 (×11): 5 mL

## 2018-05-12 MED ORDER — MIDAZOLAM HCL 2 MG/2ML IJ SOLN
INTRAMUSCULAR | Status: AC | PRN
  Administered 2018-05-12: 1 mg via INTRAVENOUS

## 2018-05-12 MED ORDER — ACETAMINOPHEN 325 MG PO TABS: 650.0000 mg | ORAL_TABLET | Freq: Four times a day (QID) | ORAL | Status: DC | PRN

## 2018-05-12 MED ORDER — FENTANYL CITRATE (PF) 100 MCG/2ML IJ SOLN
25.0000 ug | INTRAMUSCULAR | Status: DC | PRN
  Administered 2018-05-12 – 2018-05-13 (×4): 25 ug via INTRAVENOUS
  Filled 2018-05-12 (×4): qty 2

## 2018-05-12 MED ORDER — ONDANSETRON HCL 4 MG PO TABS: 4.0000 mg | ORAL_TABLET | Freq: Four times a day (QID) | ORAL | Status: DC | PRN

## 2018-05-12 NOTE — Progress Notes (Signed)
Pharmacy Antibiotic Note  Monique Castillo is a 73 y.o. female admitted on 05/11/2018 with 1wk hx abd pain, associated with N/V .  Pharmacy has been consulted for piperacillin/tazobactam dosing. Abd CT: perforated appendix with intra-abd abscess.  Today, 05/12/18 -WBC 26.5, elevated -SCr 1.95, CrCl 27 mL/min -Afebrile   Plan:  Given improvement in SCr, increase piperacillin/tazobactam to 3.375 g IV q8h EI  Continue to monitor renal function    Height: 5\' 6"  (167.6 cm) Weight: 165 lb (74.8 kg) IBW/kg (Calculated) : 59.3  Temp (24hrs), Avg:98.1 F (36.7 C), Min:98.1 F (36.7 C), Max:98.1 F (36.7 C)  Recent Labs  Lab 05/11/18 1646 05/11/18 1723 05/11/18 2017 05/12/18 0622  WBC 27.8*  --   --  26.5*  CREATININE 2.53* 2.70*  --  1.95*  LATICACIDVEN  --   --  0.9 0.7    Estimated Creatinine Clearance: 27.4 mL/min (A) (by C-G formula based on SCr of 1.95 mg/dL (H)).    Allergies  Allergen Reactions  . Sulfa Antibiotics     Antimicrobials this admission: 3/18 Zosyn >>   Dose adjustments this admission:  Microbiology results: 3/19 Blood: Sent 3/19 MRSA PCR: Negative  Thank you for allowing pharmacy to be a part of this patient's care.  Cindi Carbon, PharmD 05/12/18 8:20 AM

## 2018-05-12 NOTE — Progress Notes (Signed)
  Echocardiogram 2D Echocardiogram has been performed.  Leta Jungling M 05/12/2018, 11:35 AM

## 2018-05-12 NOTE — ED Notes (Signed)
ECHO at bedside.

## 2018-05-12 NOTE — ED Notes (Signed)
Hospitalist at bedside 

## 2018-05-12 NOTE — Progress Notes (Signed)
Pharmacy Antibiotic Note  Monique Castillo is a 73 y.o. female admitted on 05/11/2018 with 1wk hx abd pain, associated with N/V .  Pharmacy has been consulted for Zosyn dosing. Abd CT: perforated appendix with intra-abd abscess  Plan: Zosyn 3.375gm x1, then 2.25gm q8h SCr elevated, increased, monitor Plan perc drain in IR 3/19  Height: 5\' 6"  (167.6 cm) Weight: 165 lb (74.8 kg) IBW/kg (Calculated) : 59.3  Temp (24hrs), Avg:98.1 F (36.7 C), Min:98.1 F (36.7 C), Max:98.1 F (36.7 C)  Recent Labs  Lab 05/11/18 1646 05/11/18 1723 05/11/18 2017  WBC 27.8*  --   --   CREATININE 2.53* 2.70*  --   LATICACIDVEN  --   --  0.9    Estimated Creatinine Clearance: 19.8 mL/min (A) (by C-G formula based on SCr of 2.7 mg/dL (H)).    Allergies  Allergen Reactions  . Sulfa Antibiotics     Antimicrobials this admission: 3/18 Zosyn >>   Dose adjustments this admission:  Microbiology results: No Cx ordered  Thank you for allowing pharmacy to be a part of this patient's care.  Otho Bellows PharmD Pager [phone] 05/12/2018, 4:47 AM

## 2018-05-12 NOTE — Progress Notes (Signed)
Chief Complaint: Patient was seen in consultation today for  Chief Complaint  Patient presents with   Abdominal Pain   Constipation   at the request of Dr. Corliss Skains  Referring Physician(s): Dr. Corliss Skains  Supervising Physician: Jolaine Click  Patient Status: [facility] - In-pt  History of Present Illness: Monique Castillo is a 73 y.o. female who presented with abdominal pains and was found to have acute appendicitis with perforation and abscess. IR is asked to place perc drain. PMHx, meds, labs, imaging, allergies reviewed. She also presented with new onset A. Fib with RVR. She has been placed on cardizem drip and HR is now mid 80s. Has been NPO today as directed. Family at bedside.   History reviewed. No pertinent past medical history.  Past Surgical History:  Procedure Laterality Date   PARTIAL HYSTERECTOMY      Allergies: Sulfa antibiotics  Medications:  Current Facility-Administered Medications:    0.9 %  sodium chloride infusion, , Intravenous, Continuous, Doutova, Anastassia, MD, Last Rate: 100 mL/hr at 05/12/18 0646   0.9 %  sodium chloride infusion, , Intravenous, PRN, Therisa Doyne, MD, Last Rate: 10 mL/hr at 05/12/18 0429, 500 mL at 05/12/18 0429   acetaminophen (TYLENOL) tablet 650 mg, 650 mg, Oral, Q6H PRN **OR** acetaminophen (TYLENOL) suppository 650 mg, 650 mg, Rectal, Q6H PRN, Doutova, Anastassia, MD   [COMPLETED] diltiazem (CARDIZEM) 1 mg/mL load via infusion 20 mg, 20 mg, Intravenous, Once, 20 mg at 05/11/18 2048 **AND** diltiazem (CARDIZEM) 100 mg in dextrose 5 % 100 mL (1 mg/mL) infusion, 5-15 mg/hr, Intravenous, Continuous, Doutova, Anastassia, MD, Last Rate: 5 mL/hr at 05/12/18 0657, 5 mg/hr at 05/12/18 0657   fentaNYL (SUBLIMAZE) injection 25 mcg, 25 mcg, Intravenous, Q2H PRN, Adela Glimpse, Anastassia, MD, 25 mcg at 05/12/18 0814   ondansetron (ZOFRAN) tablet 4 mg, 4 mg, Oral, Q6H PRN **OR** ondansetron (ZOFRAN) injection 4 mg, 4 mg, Intravenous, Q6H PRN,  Doutova, Anastassia, MD   piperacillin-tazobactam (ZOSYN) IVPB 3.375 g, 3.375 g, Intravenous, Q8H, Swayne, Mary M, RPH   sodium chloride flush (NS) 0.9 % injection 3 mL, 3 mL, Intravenous, Once, Doutova, Anastassia, MD  Current Outpatient Medications:    ibuprofen (ADVIL,MOTRIN) 200 MG tablet, Take 400 mg by mouth every 6 (six) hours as needed for moderate pain., Disp: , Rfl:     Family History  Problem Relation Age of Onset   Seizures Father    Ulcers Father    Heart failure Father     Social History   Socioeconomic History   Marital status: Married    Spouse name: Not on file   Number of children: Not on file   Years of education: Not on file   Highest education level: Not on file  Occupational History   Not on file  Social Needs   Financial resource strain: Not on file   Food insecurity:    Worry: Not on file    Inability: Not on file   Transportation needs:    Medical: Not on file    Non-medical: Not on file  Tobacco Use   Smoking status: Never Smoker   Smokeless tobacco: Never Used  Substance and Sexual Activity   Alcohol use: Yes    Comment: 1 glass wine daily   Drug use: Never   Sexual activity: Not on file  Lifestyle   Physical activity:    Days per week: Not on file    Minutes per session: Not on file   Stress: Not on file  Relationships  Social connections:    Talks on phone: Not on file    Gets together: Not on file    Attends religious service: Not on file    Active member of club or organization: Not on file    Attends meetings of clubs or organizations: Not on file    Relationship status: Not on file  Other Topics Concern   Not on file  Social History Narrative   Not on file    Review of Systems: A 12 point ROS discussed and pertinent positives are indicated in the HPI above.  All other systems are negative.  Review of Systems  Vital Signs: BP (!) 108/59    Pulse 83    Temp 98.1 F (36.7 C) (Oral)    Resp 17     Ht  (1.676 m)    Wt 74.8 kg    SpO2 94%    BMI 26.63 kg/m   Physical Exam Constitutional:      Appearance: Normal appearance.  HENT:     Mouth/Throat:     Pharynx: Oropharynx is clear.  Cardiovascular:     Rate and Rhythm: Normal rate. Rhythm irregular.     Heart sounds: Normal heart sounds.  Pulmonary:     Effort: Pulmonary effort is normal.     Breath sounds: Normal breath sounds.  Abdominal:     Palpations: Abdomen is soft.     Tenderness: There is abdominal tenderness.  Skin:    General: Skin is warm and dry.  Neurological:     General: No focal deficit present.     Mental Status: She is alert and oriented to person, place, and time.  Psychiatric:        Mood and Affect: Mood normal.        Judgment: Judgment normal.      Imaging: Ct Abdomen Pelvis Wo Contrast  Result Date: 05/11/2018 CLINICAL DATA:  Right lower abdominal pain with constipation and emesis EXAM: CT ABDOMEN AND PELVIS WITHOUT CONTRAST TECHNIQUE: Multidetector CT imaging of the abdomen and pelvis was performed following the standard protocol without IV contrast. COMPARISON:  None. FINDINGS: Lower chest: Lung bases demonstrate atelectasis at the right middle lobe and right base. Mild bronchiectasis at the right middle lobe and right base. Trace pericardial effusion. Heart size upper normal. Hepatobiliary: Calcified granuloma. No calcified gallstone or biliary dilatation Pancreas: Unremarkable. No pancreatic ductal dilatation or surrounding inflammatory changes. Spleen: Calcified granuloma Adrenals/Urinary Tract: Adrenal glands are within normal limits. Dilated left extrarenal pelvis versus parapelvic cysts. No hydroureter. No ureteral stone. Bladder is normal Stomach/Bowel: Stomach is nonenlarged. No dilated small bowel. Enlarged appendix measuring up to 15 mm in size with multiple appendicoliths. Significant right lower quadrant inflammatory changes. Vascular/Lymphatic: Nonaneurysmal aorta. Moderate aortic  atherosclerosis. No significantly enlarged lymph nodes Reproductive: Uterine calcification likely a fibroid. No adnexal mass Other: Small foci of extraluminal gas in the right lower quadrant. Gas and fluid collection inferior to the liver measures 7.4 x 5.2 x 3.4 cm. Contiguous with this is an additional right lower quadrant fluid collection measuring 5 x 3.1 x 9 cm. Smaller fluid collections anterior to the transverse colon measuring up to 2.1 cm. Musculoskeletal: Degenerative changes. Grade 1 anterolisthesis L4 on L5. No acute osseous abnormality IMPRESSION: 1. Findings consistent with acute perforated appendicitis. Small extraluminal gas locules in the right lower quadrant. Multiple gas and fluid collections extending from the inferior right hepatic lobe to the right lower quadrant consistent with intra-abdominal abscess. Appendix: Location: Right lower  quadrant Diameter: 15 mm Appendicolith: At least 3 appendicoliths Mucosal hyper-enhancement: No contrast given Extraluminal gas: Positive Periappendiceal collection: Positive Critical Value/emergent results were called by telephone at the time of interpretation on 05/11/2018 at 7:09 pm to Dr. Melene Plan , who verbally acknowledged these results. Electronically Signed   By: Jasmine Pang M.D.   On: 05/11/2018 19:09    Labs:  CBC: Recent Labs    05/11/18 1646 05/12/18 0622  WBC 27.8* 26.5*  HGB 13.0 11.1*  HCT 40.1 33.5*  PLT 290 266    COAGS: Recent Labs    05/12/18 0622  INR 1.1  APTT 26    BMP: Recent Labs    05/11/18 1646 05/11/18 1723 05/12/18 0622  NA 132*  --  135  K 3.3*  --  3.1*  CL 93*  --  102  CO2 22  --  20*  GLUCOSE 115*  --  104*  BUN 73*  --  64*  CALCIUM 8.6*  --  7.5*  CREATININE 2.53* 2.70* 1.95*  GFRNONAA 18*  --  25*  GFRAA 21*  --  29*    LIVER FUNCTION TESTS: Recent Labs    05/11/18 1646 05/12/18 0622  BILITOT 0.9 0.5  AST 26 13*  ALT 76* 63*  ALKPHOS 238* 197*  PROT 7.1 6.0*  ALBUMIN 2.8*  2.2*    TUMOR MARKERS: No results for input(s): AFPTM, CEA, CA199, CHROMGRNA in the last 8760 hours.  Assessment and Plan: Perforated appendicitis with abscess Plan for Perc drain(s) Labs reviewed. Risks and benefits discussed with the patient including bleeding, infection, damage to adjacent structures, bowel perforation/fistula connection, and sepsis.  All of the patient's questions were answered, patient is agreeable to proceed. Consent signed and in chart.    Thank you for this interesting consult.  I greatly enjoyed meeting Monique Castillo and look forward to participating in their care.  A copy of this report was sent to the requesting provider on this date.  Electronically Signed: Brayton El, PA-C 05/12/2018, 9:11 AM   I spent a total of 20 minutes in face to face in clinical consultation, greater than 50% of which was counseling/coordinating care for abscess drain

## 2018-05-12 NOTE — ED Notes (Signed)
IR PA at bedside.  Reports that patient will be worked into their list. Probably around lunch time before can be worked in.

## 2018-05-12 NOTE — ED Notes (Signed)
Called Vonna Kotyk in main lab, who confirmed they had urine culture to run new order off of.

## 2018-05-12 NOTE — ED Notes (Signed)
RN Lubertha South made aware patient will be transferred to IR prior to transfer to 2W.

## 2018-05-12 NOTE — Progress Notes (Signed)
PROGRESS NOTE    Monique Castillo  GEX:[id number] DOB: 08/25/1945 DOA: 05/11/2018 PCP: Patient, [name]    Brief Narrative:  Patient is 73 year old female who has never gone to a doctor recently.  No known chronic medical problems.  No recent medical follow-up.  She had been having some abdominal pain for about a week.  Pain was worsening so came to ER.  She was found to have ruptured appendicular abscess and rapid A. fib.  Also found to have renal insufficiency.   Assessment & Plan:   Active Problems:   Appendicitis   Hyponatremia   Hypokalemia   Dehydration   Sepsis (HCC)   Intraabdominal fluid collection   AKI (acute kidney injury) (HCC)   Atrial fibrillation with RVR (HCC)  Ruptured appendicular abscess: Clinically stabilizing.  N.p.o., IV fluids, IV Zosyn.  Blood cultures done.  Surgery following.  Seen by IR, undergoing CT-guided drain today.  Further management as per surgery.  New onset A. fib with RVR: Unknown whether new or old.  Treated with IV Cardizem and rate controlled.  She converted overnight.  Currently on minimum of Cardizem IV.  We will continue until she goes for procedures.  Will start patient on oral Cardizem today. TSH normal.  Echo pending. Is reluctant to go on anticoagulation.  Will check echocardiogram and discuss about anticoagulation choices.  She may at least consider using aspirin.  Renal insufficiency: Unknown how much of this is acute or chronic.  She is already improving with IV fluid hydration.  We will continue to monitor.  CT scan did not show any hydronephrosis or urinary retention.  Will check A1c for risk stratification.  Hypokalemia: Replaced.  Will continue to monitor.  DVT prophylaxis: SCDs. Code Status: Full code. Family Communication: Husband at the bedside. Disposition Plan: Home when is stable.   Consultants:   General surgery  Interventional radiology  Procedures:   CT-guided abdominal drain  Antimicrobials:    Continue with IV Zosyn 05/11/2018   Subjective: patient was seen and examined.  She is waiting in the emergency room for inpatient bed assignment.  She had some pain earlier and received morphine.  Denies any chest pain or palpitations.  Denies any shortness of breath.  Denies any nausea or vomiting. Patient says she never needed to go to the hospital , never felt any palpitations or chest pain.  Objective: Vitals:   05/12/18 0930 05/12/18 1000 05/12/18 1030 05/12/18 1100  BP: 102/60 (!) 104/57 99/61 110/62  Pulse: 81 78 84 81  Resp: 17 16 19 16   Temp:      TempSrc:      SpO2: 93% 91% 96% 93%  Weight:      Height:        Intake/Output Summary (Last 24 hours) at 05/12/2018 1132 Last data filed at 05/12/2018 0718 Gross per 24 hour  Intake 2697.97 ml  Output -  Net 2697.97 ml   Filed Weights   05/11/18 1640  Weight: 74.8 kg    Examination:  General exam: Appears calm and comfortable  Respiratory system: Clear to auscultation. Respiratory effort normal.  Cardiovascular system: S1 & S2 heard, RRR. No JVD, murmurs, rubs, gallops or clicks. No pedal edema. Gastrointestinal system: Abdomen is nondistended, soft and she has diffuse moderate tenderness along the right lower quadrant. No organomegaly or masses felt. Normal bowel sounds heard. Central nervous system: Alert and oriented. No focal neurological deficits. Extremities: Symmetric 5 x 5 power. Skin: No rashes, lesions or ulcers Psychiatry: Judgement  and insight appear normal. Mood & affect appropriate.     Data Reviewed: I have personally reviewed following labs and imaging studies  CBC: Recent Labs  Lab 05/11/18 1646 05/12/18 0622  WBC 27.8* 26.5*  HGB 13.0 11.1*  HCT 40.1 33.5*  MCV 93.7 92.5  PLT 290 266   Basic Metabolic Panel: Recent Labs  Lab 05/11/18 1646 05/11/18 1723 05/12/18 0622  NA 132*  --  135  K 3.3*  --  3.1*  CL 93*  --  102  CO2 22  --  20*  GLUCOSE 115*  --  104*  BUN 73*  --  64*   CREATININE 2.53* 2.70* 1.95*  CALCIUM 8.6*  --  7.5*  MG 2.7*  --  2.4  PHOS  --   --  3.6   GFR: Estimated Creatinine Clearance: 27.4 mL/min (A) (by C-G formula based on SCr of 1.95 mg/dL (H)). Liver Function Tests: Recent Labs  Lab 05/11/18 1646 05/12/18 0622  AST 26 13*  ALT 76* 63*  ALKPHOS 238* 197*  BILITOT 0.9 0.5  PROT 7.1 6.0*  ALBUMIN 2.8* 2.2*   Recent Labs  Lab 05/11/18 1646  LIPASE 36   No results for input(s): AMMONIA in the last 168 hours. Coagulation Profile: Recent Labs  Lab 05/12/18 0622  INR 1.1   Cardiac Enzymes: No results for input(s): CKTOTAL, CKMB, CKMBINDEX, TROPONINI in the last 168 hours. BNP (last 3 results) No results for input(s): PROBNP in the last 8760 hours. HbA1C: No results for input(s): HGBA1C in the last 72 hours. CBG: No results for input(s): GLUCAP in the last 168 hours. Lipid Profile: No results for input(s): CHOL, HDL, LDLCALC, TRIG, CHOLHDL, LDLDIRECT in the last 72 hours. Thyroid Function Tests: Recent Labs    05/12/18 0622  TSH 3.263   Anemia Panel: No results for input(s): VITAMINB12, FOLATE, FERRITIN, TIBC, IRON, RETICCTPCT in the last 72 hours. Sepsis Labs: Recent Labs  Lab 05/11/18 2017 05/12/18 0622  PROCALCITON  --  7.35  LATICACIDVEN 0.9 0.7    Recent Results (from the past 240 hour(s))  Surgical pcr screen     Status: None   Collection Time: 05/12/18  4:11 AM  Result Value Ref Range Status   MRSA, PCR NEGATIVE NEGATIVE Final   Staphylococcus aureus NEGATIVE NEGATIVE Final    Comment: (NOTE) The Xpert SA Assay (FDA approved for NASAL specimens in patients 58 years of age and older), is one component of a comprehensive surveillance program. It is not intended to diagnose infection nor to guide or monitor treatment. Performed at [facility], [address], [place], [place] [address]          Radiology Studies: Ct Abdomen Pelvis Wo Contrast  Result Date: 05/11/2018  CLINICAL DATA:  Right lower abdominal pain with constipation and emesis EXAM: CT ABDOMEN AND PELVIS WITHOUT CONTRAST TECHNIQUE: Multidetector CT imaging of the abdomen and pelvis was performed following the standard protocol without IV contrast. COMPARISON:  None. FINDINGS: Lower chest: Lung bases demonstrate atelectasis at the right middle lobe and right base. Mild bronchiectasis at the right middle lobe and right base. Trace pericardial effusion. Heart size upper normal. Hepatobiliary: Calcified granuloma. No calcified gallstone or biliary dilatation Pancreas: Unremarkable. No pancreatic ductal dilatation or surrounding inflammatory changes. Spleen: Calcified granuloma Adrenals/Urinary Tract: Adrenal glands are within normal limits. Dilated left extrarenal pelvis versus parapelvic cysts. No hydroureter. No ureteral stone. Bladder is normal Stomach/Bowel: Stomach is nonenlarged. No dilated small bowel. Enlarged appendix  measuring up to 15 mm in size with multiple appendicoliths. Significant right lower quadrant inflammatory changes. Vascular/Lymphatic: Nonaneurysmal aorta. Moderate aortic atherosclerosis. No significantly enlarged lymph nodes Reproductive: Uterine calcification likely a fibroid. No adnexal mass Other: Small foci of extraluminal gas in the right lower quadrant. Gas and fluid collection inferior to the liver measures 7.4 x 5.2 x 3.4 cm. Contiguous with this is an additional right lower quadrant fluid collection measuring 5 x 3.1 x 9 cm. Smaller fluid collections anterior to the transverse colon measuring up to 2.1 cm. Musculoskeletal: Degenerative changes. Grade 1 anterolisthesis L4 on L5. No acute osseous abnormality IMPRESSION: 1. Findings consistent with acute perforated appendicitis. Small extraluminal gas locules in the right lower quadrant. Multiple gas and fluid collections extending from the inferior right hepatic lobe to the right lower quadrant consistent with intra-abdominal abscess.  Appendix: Location: Right lower quadrant Diameter: 15 mm Appendicolith: At least 3 appendicoliths Mucosal hyper-enhancement: No contrast given Extraluminal gas: Positive Periappendiceal collection: Positive Critical Value/emergent results were called by telephone at the time of interpretation on 05/11/2018 at 7:09 pm to Dr. Melene Plan , who verbally acknowledged these results. Electronically Signed   By: Jasmine Pang M.D.   On: 05/11/2018 19:09        Scheduled Meds: . diltiazem  30 mg Oral Q6H  . sodium chloride flush  3 mL Intravenous Once   Continuous Infusions: . sodium chloride 100 mL/hr at 05/12/18 0646  . sodium chloride 500 mL (05/12/18 0429)  . diltiazem (CARDIZEM) infusion 5 mg/hr (05/12/18 0657)  . piperacillin-tazobactam (ZOSYN)  IV       LOS: 1 day    Time spent: 25 minutes    Dorcas Carrow, MD [facility] Pager [phone]  If 7PM-7AM, please contact night-coverage [email or web address] Password [facility] 05/12/2018, 11:32 AM

## 2018-05-12 NOTE — ED Notes (Signed)
Per Jarvis Newcomer MD, ok to give fentanyl.

## 2018-05-12 NOTE — ED Notes (Signed)
Called IR and asked for an ETA for procedure. IR said to send pt to room 107. Will call report to the floor.

## 2018-05-12 NOTE — Procedures (Signed)
RLQ 10 Fr abscess drain Pus EBL 0 Comp 0

## 2018-05-12 NOTE — Progress Notes (Signed)
    CC: Abdominal pain/constipation  Subjective: She is still having  pain RLQ.  but not that uncomfortable.  Awaiting IR drain placement.  Objective: Vital signs in last 24 hours: Temp:  [98.1 F (36.7 C)] 98.1 F (36.7 C) (03/18 1633) Pulse Rate:  [74-136] 83 (03/19 0902) Resp:  [13-23] 17 (03/19 0902) BP: (92-118)/(52-89) 108/59 (03/19 0902) SpO2:  [90 %-96 %] 94 % (03/19 0902) Weight:  [74.8 kg] 74.8 kg (03/18 1640)  No I&O recorded. Afebrile vital signs are stable, systolic blood pressure in the low 90s to 100s. Admission labs shows hyponatremia, hypokalemia, acute renal insufficiency, stable LFTs. WBC 27.8. Labs this a.m. shows a white count still elevated.,  Lactate 0.7.  Electrolytes improving, creatinine slightly improved.  CT scan shows atelectasis the right middle lobe and right base.  Trace pericardial effusion, right lower quadrant 7.4 x 5.2 x 3.4 massive fluid collection of second collection 5 x 3.1 x 9 cm.  Smaller collection measuring up to 6.1 cm anterior to the transverse colon.  Intake/Output from previous day: 03/18 0701 - 03/19 0700 In: 2648 [IV Piggyback:2648] Out: -  Intake/Output this shift: Total I/O In: 50 [IV Piggyback:50] Out: -   General appearance: alert, cooperative and no distress Resp: clear to auscultation bilaterally GI: Tender RLQ.  remainder of the abdomen is soft and fairly nontender.    Lab Results:  Recent Labs    05/11/18 1646 05/12/18 0622  WBC 27.8* 26.5*  HGB 13.0 11.1*  HCT 40.1 33.5*  PLT 290 266    BMET Recent Labs    05/11/18 1646 05/11/18 1723 05/12/18 0622  NA 132*  --  135  K 3.3*  --  3.1*  CL 93*  --  102  CO2 22  --  20*  GLUCOSE 115*  --  104*  BUN 73*  --  64*  CREATININE 2.53* 2.70* 1.95*  CALCIUM 8.6*  --  7.5*   PT/INR Recent Labs    05/12/18 0622  LABPROT 14.5  INR 1.1    Recent Labs  Lab 05/11/18 1646 05/12/18 0622  AST 26 13*  ALT 76* 63*  ALKPHOS 238* 197*  BILITOT 0.9 0.5   PROT 7.1 6.0*  ALBUMIN 2.8* 2.2*     Lipase     Component Value Date/Time   LIPASE 36 05/11/2018 1646     Prior to Admission medications   Medication Sig Start Date End Date Taking? Authorizing Provider  ibuprofen (ADVIL,MOTRIN) 200 MG tablet Take 400 mg by mouth every 6 (six) hours as needed for moderate pain.   Yes [provider]    Medications: . diltiazem  30 mg Oral Q6H  . sodium chloride flush  3 mL Intravenous Once   . sodium chloride 100 mL/hr at 05/12/18 0646  . sodium chloride 500 mL (05/12/18 0429)  . diltiazem (CARDIZEM) infusion 5 mg/hr (05/12/18 0657)  . piperacillin-tazobactam (ZOSYN)  IV     Assessment/Plan Atrial fibrillation with RVR Hyponatremia/hypokalemia Acute kidney injury Dehydration Probable UTI  Acute perforated appendicitis with 2 large intra-abdominal abscesses/multiple smaller collections Sepsis  FEN: IV fluids/n.p.o. ID: Zosyn 3/18 >> day 2 DVT: SCDs only -after IR procedure she can have DVT anticoagulation from our standpoint Follow-up: To be determined  Plan:  IR drain placement today, IV fluid hydration, antibiotics   LOS: 1 day    Monique Castillo 05/12/2018 [phone]

## 2018-05-12 NOTE — ED Notes (Signed)
Pt placed on Hospital bed

## 2018-05-12 NOTE — ED Notes (Signed)
ED TO INPATIENT HANDOFF REPORT  ED Nurse Name and Phone #: Lubertha South RN  S Name/Age/Gender Monique Castillo 73 y.o. female Room/Bed: WA21/WA21  Code Status   Code Status: Full Code  Home/SNF/Other Home Patient oriented to: self, place, time and situation Is this baseline? Yes   Triage Complete: Triage complete  Chief Complaint Possible appendicitis  Triage Note Patient c/o right lower abdominal pain, constipation, and emesis 2 days ago, but none since.   Allergies Allergies  Allergen Reactions  . Sulfa Antibiotics     Level of Care/Admitting Diagnosis ED Disposition    ED Disposition Condition Comment   Admit  Hospital Area: [facility] [[id number]]  Level of Care: Stepdown [14]  Admit to SDU based on following criteria: Hemodynamic compromise or significant risk of instability:  Patient requiring short term acute titration and management of vasoactive drips, and invasive monitoring (i.e., CVP and Arterial line).  Diagnosis: Appendicitis [161096]  Admitting Physician: Therisa Doyne [[id number]]  Attending Physician: Therisa Doyne [[id number]]  Estimated length of stay: 3 - 4 days  Certification:: I certify this patient will need inpatient services for at least 2 midnights  PT Class (Do Not Modify): Inpatient [101]  PT Acc Code (Do Not Modify): Private [1]       B Medical/Surgery History History reviewed. No pertinent past medical history. Past Surgical History:  Procedure Laterality Date  . PARTIAL HYSTERECTOMY       A IV Location/Drains/Wounds Patient Lines/Drains/Airways Status   Active Line/Drains/Airways    Name:   Placement date:   Placement time:   Site:   Days:   Peripheral IV 05/11/18 Left Antecubital   05/11/18    1859    Antecubital   1   Peripheral IV 05/12/18 Right Antecubital   05/12/18    0454    Antecubital   less than 1          Intake/Output Last 24 hours  Intake/Output Summary (Last 24 hours) at 05/12/2018 1625 Last data  filed at 05/12/2018 0981 Gross per 24 hour  Intake 2697.97 ml  Output -  Net 2697.97 ml    Labs/Imaging Results for orders placed or performed during the hospital encounter of 05/11/18 (from the past 48 hour(s))  Lipase, blood     Status: None   Collection Time: 05/11/18  4:46 PM  Result Value Ref Range   Lipase 36 11 - 51 U/L    Comment: Performed at [facility], [address], [place], [place] [address]  Comprehensive metabolic panel     Status: Abnormal   Collection Time: 05/11/18  4:46 PM  Result Value Ref Range   Sodium 132 (L) 135 - 145 mmol/L   Potassium 3.3 (L) 3.5 - 5.1 mmol/L   Chloride 93 (L) 98 - 111 mmol/L   CO2 22 22 - 32 mmol/L   Glucose, Bld 115 (H) 70 - 99 mg/dL   BUN 73 (H) 8 - 23 mg/dL   Creatinine, Ser 8.29 (H) 0.44 - 1.00 mg/dL   Calcium 8.6 (L) 8.9 - 10.3 mg/dL   Total Protein 7.1 6.5 - 8.1 g/dL   Albumin 2.8 (L) 3.5 - 5.0 g/dL   AST 26 15 - 41 U/L   ALT 76 (H) 0 - 44 U/L   Alkaline Phosphatase 238 (H) 38 - 126 U/L   Total Bilirubin 0.9 0.3 - 1.2 mg/dL   GFR calc non Af Amer 18 (L) >60 mL/min   GFR calc Af Amer 21 (L) >  60 mL/min   Anion gap 17 (H) 5 - 15    Comment: Performed at [facility], [address] [place], [place] [address]  CBC     Status: Abnormal   Collection Time: 05/11/18  4:46 PM  Result Value Ref Range   WBC 27.8 (H) 4.0 - 10.5 K/uL   RBC 4.28 3.87 - 5.11 MIL/uL   Hemoglobin 13.0 12.0 - 15.0 g/dL   HCT 62.1 30.8 - 65.7 %   MCV 93.7 80.0 - 100.0 fL   MCH 30.4 26.0 - 34.0 pg   MCHC 32.4 30.0 - 36.0 g/dL   RDW 84.6 96.2 - 95.2 %   Platelets 290 150 - 400 K/uL   nRBC 0.0 0.0 - 0.2 %    Comment: Performed at [facility], [address] [place], [place] [address]  Urinalysis, Routine w reflex microscopic     Status: Abnormal   Collection Time: 05/11/18  4:46 PM  Result Value Ref Range   Color, Urine AMBER (A) YELLOW    Comment: BIOCHEMICALS MAY BE AFFECTED BY COLOR    APPearance CLOUDY (A) CLEAR   Specific Gravity, Urine 1.017 1.005 - 1.030   pH 5.0 5.0 - 8.0   Glucose, UA NEGATIVE NEGATIVE mg/dL   Hgb urine dipstick MODERATE (A) NEGATIVE   Bilirubin Urine NEGATIVE NEGATIVE   Ketones, ur NEGATIVE NEGATIVE mg/dL   Protein, ur 30 (A) NEGATIVE mg/dL   Nitrite NEGATIVE NEGATIVE   Leukocytes,Ua LARGE (A) NEGATIVE   RBC / HPF >50 (H) 0 - 5 RBC/hpf   WBC, UA >50 (H) 0 - 5 WBC/hpf   Bacteria, UA FEW (A) NONE SEEN   Squamous Epithelial / LPF 6-10 0 - 5   WBC Clumps PRESENT    Mucus PRESENT    Hyaline Casts, UA PRESENT    Amorphous Crystal PRESENT    Non Squamous Epithelial 6-10 (A) NONE SEEN    Comment: Performed at [facility], [address], [place], [place] [address]  Magnesium     Status: Abnormal   Collection Time: 05/11/18  4:46 PM  Result Value Ref Range   Magnesium 2.7 (H) 1.7 - 2.4 mg/dL    Comment: Performed at [facility], [address], [place], [place] [address]  I-Stat Creatinine, ED (not at [facility])     Status: Abnormal   Collection Time: 05/11/18  5:23 PM  Result Value Ref Range   Creatinine, Ser 2.70 (H) 0.44 - 1.00 mg/dL  Sodium, urine, random     Status: None   Collection Time: 05/11/18  6:47 PM  Result Value Ref Range   Sodium, Ur 12 mmol/L    Comment: Performed at [facility], [address], [place], [place] [address]  Osmolality, urine     Status: None   Collection Time: 05/11/18  6:47 PM  Result Value Ref Range   Osmolality, Ur 334 300 - 900 mOsm/kg    Comment: Performed at [facility], [address], [place], [place] [address]  Creatinine, urine, random     Status: None   Collection Time: 05/11/18  6:47 PM  Result Value Ref Range   Creatinine, Urine 242.39 mg/dL    Comment: Performed at [facility], [address], [place], [place] [address]  Lactic acid, plasma     Status: None   Collection Time: 05/11/18  8:17 PM  Result Value Ref  Range   Lactic Acid, Venous  0.9 0.5 - 1.9 mmol/L    Comment: Performed at [facility], [address], [place], [place] [address]  Surgical pcr screen     Status: None   Collection Time: 05/12/18  4:11 AM  Result Value Ref Range   MRSA, PCR NEGATIVE NEGATIVE   Staphylococcus aureus NEGATIVE NEGATIVE    Comment: (NOTE) The Xpert SA Assay (FDA approved for NASAL specimens in patients 81 years of age and older), is one component of a comprehensive surveillance program. It is not intended to diagnose infection nor to guide or monitor treatment. Performed at [facility], [address] [place], [place] [address]   Lactic acid, plasma     Status: None   Collection Time: 05/12/18  6:22 AM  Result Value Ref Range   Lactic Acid, Venous 0.7 0.5 - 1.9 mmol/L    Comment: Performed at [facility], [address] [place], [place] [address]  Procalcitonin     Status: None   Collection Time: 05/12/18  6:22 AM  Result Value Ref Range   Procalcitonin 7.35 ng/mL    Comment:        Interpretation: PCT > 2 ng/mL: Systemic infection (sepsis) is likely, unless other causes are known. (NOTE)       Sepsis PCT Algorithm           Lower Respiratory Tract                                      Infection PCT Algorithm    ----------------------------     ----------------------------         PCT < 0.25 ng/mL                PCT < 0.10 ng/mL         Strongly encourage             Strongly discourage   discontinuation of antibiotics    initiation of antibiotics    ----------------------------     -----------------------------       PCT 0.25 - 0.50 ng/mL            PCT 0.10 - 0.25 ng/mL               OR       >80% decrease in PCT            Discourage initiation of                                            antibiotics      Encourage discontinuation           of antibiotics    ----------------------------     -----------------------------          PCT >= 0.50 ng/mL              PCT 0.26 - 0.50 ng/mL               AND       <80% decrease in PCT              Encourage initiation of  antibiotics       Encourage continuation           of antibiotics    ----------------------------     -----------------------------        PCT >= 0.50 ng/mL                  PCT > 0.50 ng/mL               AND         increase in PCT                  Strongly encourage                                      initiation of antibiotics    Strongly encourage escalation           of antibiotics                                     -----------------------------                                           PCT <= 0.25 ng/mL                                                 OR                                        > 80% decrease in PCT                                     Discontinue / Do not initiate                                             antibiotics Performed at [facility], [address] [place], [place] [address]   Protime-INR     Status: None   Collection Time: 05/12/18  6:22 AM  Result Value Ref Range   Prothrombin Time 14.5 11.4 - 15.2 seconds   INR 1.1 0.8 - 1.2    Comment: (NOTE) INR goal varies based on device and disease states. Performed at [facility], [address], [place], [place] [address]   APTT     Status: None   Collection Time: 05/12/18  6:22 AM  Result Value Ref Range   aPTT 26 24 - 36 seconds    Comment: Performed at [facility], [address], [place], [place] [address]  Magnesium     Status: None   Collection Time: 05/12/18  6:22 AM  Result Value Ref Range   Magnesium 2.4 1.7 - 2.4 mg/dL    Comment: Performed at [facility], [address] [place], [place] [address]  Phosphorus  Status: None   Collection Time: 05/12/18  6:22 AM  Result Value Ref Range   Phosphorus 3.6 2.5 - 4.6 mg/dL    Comment:  Performed at [facility], [address], [place], [place] [address]  TSH     Status: None   Collection Time: 05/12/18  6:22 AM  Result Value Ref Range   TSH 3.263 0.350 - 4.500 uIU/mL    Comment: Performed by a 3rd Generation assay with a functional sensitivity of <=0.01 uIU/mL. Performed at [facility], [address] [place], [place] [address]   Comprehensive metabolic panel     Status: Abnormal   Collection Time: 05/12/18  6:22 AM  Result Value Ref Range   Sodium 135 135 - 145 mmol/L   Potassium 3.1 (L) 3.5 - 5.1 mmol/L   Chloride 102 98 - 111 mmol/L   CO2 20 (L) 22 - 32 mmol/L   Glucose, Bld 104 (H) 70 - 99 mg/dL   BUN 64 (H) 8 - 23 mg/dL   Creatinine, Ser 6.57 (H) 0.44 - 1.00 mg/dL   Calcium 7.5 (L) 8.9 - 10.3 mg/dL   Total Protein 6.0 (L) 6.5 - 8.1 g/dL   Albumin 2.2 (L) 3.5 - 5.0 g/dL   AST 13 (L) 15 - 41 U/L   ALT 63 (H) 0 - 44 U/L   Alkaline Phosphatase 197 (H) 38 - 126 U/L   Total Bilirubin 0.5 0.3 - 1.2 mg/dL   GFR calc non Af Amer 25 (L) >60 mL/min   GFR calc Af Amer 29 (L) >60 mL/min   Anion gap 13 5 - 15    Comment: Performed at [facility], [address] [place], [place] [address]  CBC     Status: Abnormal   Collection Time: 05/12/18  6:22 AM  Result Value Ref Range   WBC 26.5 (H) 4.0 - 10.5 K/uL   RBC 3.62 (L) 3.87 - 5.11 MIL/uL   Hemoglobin 11.1 (L) 12.0 - 15.0 g/dL   HCT 29.5 (L) 28.4 - 13.2 %   MCV 92.5 80.0 - 100.0 fL   MCH 30.7 26.0 - 34.0 pg   MCHC 33.1 30.0 - 36.0 g/dL   RDW 44.0 10.2 - 72.5 %   Platelets 266 150 - 400 K/uL   nRBC 0.0 0.0 - 0.2 %    Comment: Performed at [facility], [address] [place], [place] [address]   Ct Abdomen Pelvis Wo Contrast  Result Date: 05/11/2018 CLINICAL DATA:  Right lower abdominal pain with constipation and emesis EXAM: CT ABDOMEN AND PELVIS WITHOUT CONTRAST TECHNIQUE: Multidetector CT imaging of the abdomen and pelvis was  performed following the standard protocol without IV contrast. COMPARISON:  None. FINDINGS: Lower chest: Lung bases demonstrate atelectasis at the right middle lobe and right base. Mild bronchiectasis at the right middle lobe and right base. Trace pericardial effusion. Heart size upper normal. Hepatobiliary: Calcified granuloma. No calcified gallstone or biliary dilatation Pancreas: Unremarkable. No pancreatic ductal dilatation or surrounding inflammatory changes. Spleen: Calcified granuloma Adrenals/Urinary Tract: Adrenal glands are within normal limits. Dilated left extrarenal pelvis versus parapelvic cysts. No hydroureter. No ureteral stone. Bladder is normal Stomach/Bowel: Stomach is nonenlarged. No dilated small bowel. Enlarged appendix measuring up to 15 mm in size with multiple appendicoliths. Significant right lower quadrant inflammatory changes. Vascular/Lymphatic: Nonaneurysmal aorta. Moderate aortic atherosclerosis. No significantly enlarged lymph nodes Reproductive: Uterine calcification likely a fibroid. No adnexal mass Other: Small foci of extraluminal gas  in the right lower quadrant. Gas and fluid collection inferior to the liver measures 7.4 x 5.2 x 3.4 cm. Contiguous with this is an additional right lower quadrant fluid collection measuring 5 x 3.1 x 9 cm. Smaller fluid collections anterior to the transverse colon measuring up to 2.1 cm. Musculoskeletal: Degenerative changes. Grade 1 anterolisthesis L4 on L5. No acute osseous abnormality IMPRESSION: 1. Findings consistent with acute perforated appendicitis. Small extraluminal gas locules in the right lower quadrant. Multiple gas and fluid collections extending from the inferior right hepatic lobe to the right lower quadrant consistent with intra-abdominal abscess. Appendix: Location: Right lower quadrant Diameter: 15 mm Appendicolith: At least 3 appendicoliths Mucosal hyper-enhancement: No contrast given Extraluminal gas: Positive Periappendiceal  collection: Positive Critical Value/emergent results were called by telephone at the time of interpretation on 05/11/2018 at 7:09 pm to Dr. Melene PlanAN FLOYD , who verbally acknowledged these results. Electronically Signed   By: Jasmine PangKim  Fujinaga M.D.   On: 05/11/2018 19:09    Pending Labs Unresulted Labs (From admission, onward)    Start     Ordered   05/13/18 0500  CBC with Differential/Platelet  Tomorrow morning,   R     05/12/18 1125   05/13/18 0500  Basic metabolic panel  Tomorrow morning,   R     05/12/18 1125   05/13/18 0500  Hemoglobin A1c  Tomorrow morning,   R     05/12/18 1126   05/12/18 1020  Culture, Urine  ONCE - STAT,   R     05/12/18 1019   05/12/18 0600  Culture, blood (x 2)  BLOOD CULTURE X 2,   STAT    Comments:  INITIATE ANTIBIOTICS WITHIN 1 HOUR AFTER BLOOD CULTURES DRAWN.  If unable to obtain blood cultures, call MD immediately regarding antibiotic instructions.    05/12/18 0600          Vitals/Pain Today's Vitals   05/12/18 1505 05/12/18 1534 05/12/18 1600 05/12/18 1607  BP:  106/62 (!) 111/57 (!) 111/57  Pulse:  73 79 80  Resp:  16 17 19   Temp:      TempSrc:      SpO2:  92% 94% 93%  Weight:      Height:      PainSc: 3        Isolation Precautions No active isolations  Medications Medications  sodium chloride flush (NS) 0.9 % injection 3 mL (3 mLs Intravenous Not Given 05/11/18 1910)  diltiazem (CARDIZEM) 1 mg/mL load via infusion 20 mg (20 mg Intravenous Bolus from Bag 05/11/18 2048)    And  diltiazem (CARDIZEM) 100 mg in dextrose 5 % 100 mL (1 mg/mL) infusion (5 mg/hr Intravenous Rate/Dose Verify 05/12/18 1528)  acetaminophen (TYLENOL) tablet 650 mg (has no administration in time range)    Or  acetaminophen (TYLENOL) suppository 650 mg (has no administration in time range)  ondansetron (ZOFRAN) tablet 4 mg (has no administration in time range)    Or  ondansetron (ZOFRAN) injection 4 mg (has no administration in time range)  0.9 %  sodium chloride infusion  ( Intravenous Rate/Dose Verify 05/12/18 1528)  fentaNYL (SUBLIMAZE) injection 25 mcg (25 mcg Intravenous Given 05/12/18 1434)  0.9 %  sodium chloride infusion (500 mLs Intravenous New Bag/Given 05/12/18 0429)  piperacillin-tazobactam (ZOSYN) IVPB 3.375 g (3.375 g Intravenous New Bag/Given 05/12/18 1315)  diltiazem (CARDIZEM) tablet 30 mg (30 mg Oral Given 05/12/18 1310)  sodium chloride 0.9 % bolus 1,000 mL (0 mLs Intravenous Stopped 05/11/18 2013)  ondansetron [facility]) injection 4 mg (4 mg Intravenous Given 05/11/18 1903)  morphine 4 MG/ML injection 4 mg (4 mg Intravenous Given 05/11/18 1903)  piperacillin-tazobactam (ZOSYN) IVPB 3.375 g (0 g Intravenous Stopped 05/11/18 2013)  sodium chloride 0.9 % bolus 1,000 mL (0 mLs Intravenous Stopped 05/11/18 2310)  potassium chloride 10 mEq in 100 mL IVPB (0 mEq Intravenous Stopped 05/11/18 2330)  sodium chloride 0.9 % bolus 500 mL (0 mLs Intravenous Stopped 05/11/18 2215)    Mobility walks Low fall risk   Focused Assessments    R Recommendations: See Admitting Provider Note  Report given to:   Additional Notes:

## 2018-05-13 LAB — BASIC METABOLIC PANEL
Anion gap: 9 (ref 5–15)
BUN: 46 mg/dL — ABNORMAL HIGH (ref 8–23)
CALCIUM: 7.5 mg/dL — AB (ref 8.9–10.3)
CO2: 20 mmol/L — ABNORMAL LOW (ref 22–32)
Chloride: 108 mmol/L (ref 98–111)
Creatinine, Ser: 1.44 mg/dL — ABNORMAL HIGH (ref 0.44–1.00)
GFR calc Af Amer: 42 mL/min — ABNORMAL LOW (ref >60)
GFR calc non Af Amer: 36 mL/min — ABNORMAL LOW (ref >60)
Glucose, Bld: 117 mg/dL — ABNORMAL HIGH (ref 70–99)
Potassium: 2.9 mmol/L — ABNORMAL LOW (ref 3.5–5.1)
Sodium: 137 mmol/L (ref 135–145)

## 2018-05-13 LAB — HEMOGLOBIN A1C
HEMOGLOBIN A1C: 5.8 % — AB (ref 4.8–5.6)
Mean Plasma Glucose: 119.76 mg/dL

## 2018-05-13 LAB — CBC WITH DIFFERENTIAL/PLATELET
Abs Immature Granulocytes: 1.42 10*3/uL — ABNORMAL HIGH (ref 0.00–0.07)
BASOS ABS: 0.1 10*3/uL (ref 0.0–0.1)
Basophils Relative: 0 %
Eosinophils Absolute: 0.2 10*3/uL (ref 0.0–0.5)
Eosinophils Relative: 1 %
HCT: 32.9 % — ABNORMAL LOW (ref 36.0–46.0)
Hemoglobin: 10.5 g/dL — ABNORMAL LOW (ref 12.0–15.0)
Immature Granulocytes: 7 %
Lymphocytes Relative: 7 %
Lymphs Abs: 1.4 10*3/uL (ref 0.7–4.0)
MCH: 30.4 pg (ref 26.0–34.0)
MCHC: 31.9 g/dL (ref 30.0–36.0)
MCV: 95.4 fL (ref 80.0–100.0)
Monocytes Absolute: 1.6 10*3/uL — ABNORMAL HIGH (ref 0.1–1.0)
Monocytes Relative: 8 %
NEUTROS PCT: 77 %
Neutro Abs: 15.8 10*3/uL — ABNORMAL HIGH (ref 1.7–7.7)
Platelets: 261 10*3/uL (ref 150–400)
RBC: 3.45 MIL/uL — AB (ref 3.87–5.11)
RDW: 14.1 % (ref 11.5–15.5)
WBC: 20.5 10*3/uL — ABNORMAL HIGH (ref 4.0–10.5)
nRBC: 0 % (ref 0.0–0.2)

## 2018-05-13 LAB — URINE CULTURE

## 2018-05-13 MED ORDER — DILTIAZEM HCL ER COATED BEADS 120 MG PO CP24
120.0000 mg | ORAL_CAPSULE | Freq: Every day | ORAL | Status: DC
  Administered 2018-05-13 – 2018-05-14 (×2): 120 mg via ORAL
  Filled 2018-05-13 (×2): qty 1

## 2018-05-13 MED ORDER — LIP MEDEX EX OINT
TOPICAL_OINTMENT | CUTANEOUS | Status: AC
  Administered 2018-05-13: 15:00:00
  Filled 2018-05-13: qty 7

## 2018-05-13 MED ORDER — TRAZODONE HCL 50 MG PO TABS
50.0000 mg | ORAL_TABLET | Freq: Once | ORAL | Status: AC
  Administered 2018-05-13: 50 mg via ORAL
  Filled 2018-05-13: qty 1

## 2018-05-13 MED ORDER — POTASSIUM CHLORIDE CRYS ER 20 MEQ PO TBCR
40.0000 meq | EXTENDED_RELEASE_TABLET | Freq: Two times a day (BID) | ORAL | Status: AC
  Administered 2018-05-13 – 2018-05-15 (×6): 40 meq via ORAL
  Filled 2018-05-13 (×6): qty 2

## 2018-05-13 MED ORDER — ENOXAPARIN SODIUM 40 MG/0.4ML ~~LOC~~ SOLN
40.0000 mg | SUBCUTANEOUS | Status: DC
  Filled 2018-05-13 (×5): qty 0.4

## 2018-05-13 MED ORDER — ASPIRIN EC 81 MG PO TBEC
81.0000 mg | DELAYED_RELEASE_TABLET | Freq: Every day | ORAL | Status: DC
  Administered 2018-05-13 – 2018-05-17 (×5): 81 mg via ORAL
  Filled 2018-05-13 (×5): qty 1

## 2018-05-13 MED ORDER — SENNOSIDES-DOCUSATE SODIUM 8.6-50 MG PO TABS
1.0000 | ORAL_TABLET | Freq: Every evening | ORAL | Status: DC | PRN
  Administered 2018-05-13: 2 via ORAL
  Filled 2018-05-13: qty 2

## 2018-05-13 NOTE — Progress Notes (Signed)
PROGRESS NOTE    Monique Castillo  EAV:[id number] DOB: 17-Dec-1945 DOA: 05/11/2018 PCP: Patient, [name]    Brief Narrative:  Patient is 73 year old female who has never gone to a doctor recently.  No known chronic medical problems.  No recent medical follow-up.  She had been having some abdominal pain for about a week.  Pain was worsening so came to ER.  She was found to have ruptured appendicular abscess and rapid A. fib.  Also found to have renal insufficiency.  Assessment & Plan:   Active Problems:   Appendicitis   Hyponatremia   Hypokalemia   Dehydration   Sepsis (HCC)   Intraabdominal fluid collection   AKI (acute kidney injury) (HCC)   Atrial fibrillation with RVR (HCC)  Ruptured appendicular abscess: Clinically stabilizing.  Status post percutaneous drain.  Clinically improving.  On regular diet.  On IV Zosyn.  Peritoneal cultures pending.  Followed by surgery.  Ambulate and advance activities.   Paroxysmal A. fib with RVR: Unknown whether new or old.  Treated with IV Cardizem and rate controlled.  Currently rate controlled in sinus rhythm.  Will change to long-acting Cardizem.  TSH normal.  Echocardiogram with normal ejection fraction.    Unadjusted stroke rate    CHADVASC 2 score  0 points: 0.2% per year  1 point: 0.6% per year  2 points: 2.2% per year  3 points: 3.2% per year  4 points: 4.8% per year  5 points: 7.2% per year  6 points: 9.7% per year  7 points: 11.2% per year  8 points: 10.8% per year  9 points: 12.2% per year    HAS-BLED score interpretation     0 points: 1.13 bleeds per 100 patient-years  1 point: 1.02 bleeds per 100 patient-years  2 points: 1.88 bleeds per 100 patient-years  3 points: 3.74 bleeds per 100 patient-years  4 points: 8.70 bleeds per 100 patient-years  5 to 9 points: Insufficient data    Patient will benefit with therapeutic anticoagulation.  We discussed in detail about choices including different novel anticoagulant  therapies.  After careful discussion, patient really does not want to start any blood thinners.  Patient is also not very optimistic whether she will continue to take even aspirin.  I advised her at least to consider aspirin as suboptimal therapeutic options and she agreed.  Will start patient on aspirin 81 mg daily.  Renal insufficiency: Unknown how much of this is acute or chronic.  She is already improving with IV fluid hydration.  We will continue to monitor.  CT scan did not show any hydronephrosis or urinary retention.  A1c is borderline elevated.  Will suggest dietary modification.  Hypokalemia: Replaced.  Persistently low.  Will replace and monitor levels.  DVT prophylaxis: SCDs. Code Status: Full code. Family Communication: No family at bedside. Disposition Plan: Home when is stable. Transfer to general bed.  Discontinue telemetry.  Advance activities and ambulate in the hallway.   Consultants:   General surgery  Interventional radiology  Procedures:   CT-guided abdominal drain  Antimicrobials:   Continue with IV Zosyn 05/11/2018   Subjective: patient was seen and examined.  Has minimum abdominal pain.  Denies any complaints.  Copious drainage from the NG tube.  More than 500 mL.  Cultures pending.  Remains in sinus rhythm.  Denies any chest pain or palpitations.  Objective: Vitals:   05/13/18 0514 05/13/18 0700 05/13/18 0800 05/13/18 0900  BP: 120/76  119/67   Pulse:  62 72  71  Resp:  13 19 15   Temp:      TempSrc:      SpO2: 94% 93% 94% 95%  Weight:      Height:        Intake/Output Summary (Last 24 hours) at 05/13/2018 0947 Last data filed at 05/13/2018 0914 Gross per 24 hour  Intake 3785.18 ml  Output 575 ml  Net 3210.18 ml   Filed Weights   05/11/18 1640  Weight: 74.8 kg    Examination:  General exam: Appears calm and comfortable  Respiratory system: Clear to auscultation. Respiratory effort normal.  Cardiovascular system: S1 & S2 heard, RRR. No  JVD, murmurs, rubs, gallops or clicks. No pedal edema. Gastrointestinal system: Abdomen is nondistended, soft and she has diffuse moderate tenderness along the right lower quadrant with percutaneous drain in place.  No organomegaly or masses felt. Normal bowel sounds heard. Central nervous system: Alert and oriented. No focal neurological deficits. Extremities: Symmetric 5 x 5 power. Skin: No rashes, lesions or ulcers Psychiatry: Judgement and insight appear normal. Mood & affect appropriate.     Data Reviewed: I have personally reviewed following labs and imaging studies  CBC: Recent Labs  Lab 05/11/18 1646 05/12/18 0622 05/13/18 0248  WBC 27.8* 26.5* 20.5*  NEUTROABS  --   --  15.8*  HGB 13.0 11.1* 10.5*  HCT 40.1 33.5* 32.9*  MCV 93.7 92.5 95.4  PLT 290 266 261   Basic Metabolic Panel: Recent Labs  Lab 05/11/18 1646 05/11/18 1723 05/12/18 0622 05/13/18 0248  NA 132*  --  135 137  K 3.3*  --  3.1* 2.9*  CL 93*  --  102 108  CO2 22  --  20* 20*  GLUCOSE 115*  --  104* 117*  BUN 73*  --  64* 46*  CREATININE 2.53* 2.70* 1.95* 1.44*  CALCIUM 8.6*  --  7.5* 7.5*  MG 2.7*  --  2.4  --   PHOS  --   --  3.6  --    GFR: Estimated Creatinine Clearance: 37.1 mL/min (A) (by C-G formula based on SCr of 1.44 mg/dL (H)). Liver Function Tests: Recent Labs  Lab 05/11/18 1646 05/12/18 0622  AST 26 13*  ALT 76* 63*  ALKPHOS 238* 197*  BILITOT 0.9 0.5  PROT 7.1 6.0*  ALBUMIN 2.8* 2.2*   Recent Labs  Lab 05/11/18 1646  LIPASE 36   No results for input(s): AMMONIA in the last 168 hours. Coagulation Profile: Recent Labs  Lab 05/12/18 0622  INR 1.1   Cardiac Enzymes: No results for input(s): CKTOTAL, CKMB, CKMBINDEX, TROPONINI in the last 168 hours. BNP (last 3 results) No results for input(s): PROBNP in the last 8760 hours. HbA1C: Recent Labs    05/13/18 0248  HGBA1C 5.8*   CBG: No results for input(s): GLUCAP in the last 168 hours. Lipid Profile: No  results for input(s): CHOL, HDL, LDLCALC, TRIG, CHOLHDL, LDLDIRECT in the last 72 hours. Thyroid Function Tests: Recent Labs    05/12/18 0622  TSH 3.263   Anemia Panel: No results for input(s): VITAMINB12, FOLATE, FERRITIN, TIBC, IRON, RETICCTPCT in the last 72 hours. Sepsis Labs: Recent Labs  Lab 05/11/18 2017 05/12/18 0622  PROCALCITON  --  7.35  LATICACIDVEN 0.9 0.7    Recent Results (from the past 240 hour(s))  Surgical pcr screen     Status: None   Collection Time: 05/12/18  4:11 AM  Result Value Ref Range Status   MRSA, PCR NEGATIVE NEGATIVE  Final   Staphylococcus aureus NEGATIVE NEGATIVE Final    Comment: (NOTE) The Xpert SA Assay (FDA approved for NASAL specimens in patients 37 years of age and older), is one component of a comprehensive surveillance program. It is not intended to diagnose infection nor to guide or monitor treatment. Performed at [facility], [address], [place], [place] [address]   Aerobic/Anaerobic Culture (surgical/deep wound)     Status: None (Preliminary result)   Collection Time: 05/12/18  5:34 PM  Result Value Ref Range Status   Specimen Description   Final    ABDOMEN Performed at [facility], [address] [place], [place] [address]    Special Requests   Final    NONE Performed at [facility], [address], [place], [place] [address]    Gram Stain   Final    ABUNDANT WBC PRESENT,BOTH PMN AND MONONUCLEAR GRAM POSITIVE COCCI IN PAIRS IN CLUSTERS ABUNDANT GRAM NEGATIVE RODS Performed at [facility], [address] [place], [place] [address]    Culture PENDING  Incomplete   Report Status PENDING  Incomplete  MRSA PCR Screening     Status: None   Collection Time: 05/12/18  6:04 PM  Result Value Ref Range Status   MRSA by PCR NEGATIVE NEGATIVE Final    Comment:        The GeneXpert MRSA Assay (FDA approved for NASAL specimens only), is one component of  a comprehensive MRSA colonization surveillance program. It is not intended to diagnose MRSA infection nor to guide or monitor treatment for MRSA infections. Performed at [facility], [address] [place], [place] [address]          Radiology Studies: Ct Abdomen Pelvis Wo Contrast  Result Date: 05/11/2018 CLINICAL DATA:  Right lower abdominal pain with constipation and emesis EXAM: CT ABDOMEN AND PELVIS WITHOUT CONTRAST TECHNIQUE: Multidetector CT imaging of the abdomen and pelvis was performed following the standard protocol without IV contrast. COMPARISON:  None. FINDINGS: Lower chest: Lung bases demonstrate atelectasis at the right middle lobe and right base. Mild bronchiectasis at the right middle lobe and right base. Trace pericardial effusion. Heart size upper normal. Hepatobiliary: Calcified granuloma. No calcified gallstone or biliary dilatation Pancreas: Unremarkable. No pancreatic ductal dilatation or surrounding inflammatory changes. Spleen: Calcified granuloma Adrenals/Urinary Tract: Adrenal glands are within normal limits. Dilated left extrarenal pelvis versus parapelvic cysts. No hydroureter. No ureteral stone. Bladder is normal Stomach/Bowel: Stomach is nonenlarged. No dilated small bowel. Enlarged appendix measuring up to 15 mm in size with multiple appendicoliths. Significant right lower quadrant inflammatory changes. Vascular/Lymphatic: Nonaneurysmal aorta. Moderate aortic atherosclerosis. No significantly enlarged lymph nodes Reproductive: Uterine calcification likely a fibroid. No adnexal mass Other: Small foci of extraluminal gas in the right lower quadrant. Gas and fluid collection inferior to the liver measures 7.4 x 5.2 x 3.4 cm. Contiguous with this is an additional right lower quadrant fluid collection measuring 5 x 3.1 x 9 cm. Smaller fluid collections anterior to the transverse colon measuring up to 2.1 cm. Musculoskeletal: Degenerative changes.  Grade 1 anterolisthesis L4 on L5. No acute osseous abnormality IMPRESSION: 1. Findings consistent with acute perforated appendicitis. Small extraluminal gas locules in the right lower quadrant. Multiple gas and fluid collections extending from the inferior right hepatic lobe to the right lower quadrant consistent with intra-abdominal abscess. Appendix: Location: Right lower quadrant Diameter: 15 mm Appendicolith: At least 3 appendicoliths Mucosal hyper-enhancement: No contrast given Extraluminal  gas: Positive Periappendiceal collection: Positive Critical Value/emergent results were called by telephone at the time of interpretation on 05/11/2018 at 7:09 pm to Dr. Melene Plan , who verbally acknowledged these results. Electronically Signed   By: Jasmine Pang M.D.   On: 05/11/2018 19:09   Ct Image Guided Drainage By Percutaneous Catheter  Result Date: 05/13/2018 INDICATION: Appendicitis and periappendiceal abscess EXAM: CT GUIDED DRAINAGE OF RIGHT LOWER QUADRANT ABSCESS MEDICATIONS: The patient is currently admitted to the hospital and receiving intravenous antibiotics. The antibiotics were administered within an appropriate time frame prior to the initiation of the procedure. ANESTHESIA/SEDATION: One mg IV Versed 50 mcg IV Fentanyl Moderate Sedation Time:  12 minutes The patient was continuously monitored during the procedure by the interventional radiology nurse under my direct supervision. COMPLICATIONS: None immediate. TECHNIQUE: Informed written consent was obtained from the patient after a thorough discussion of the procedural risks, benefits and alternatives. All questions were addressed. Maximal Sterile Barrier Technique was utilized including caps, mask, sterile gowns, sterile gloves, sterile drape, hand hygiene and skin antiseptic. A timeout was performed prior to the initiation of the procedure. PROCEDURE: The right lower quadrant was prepped with ChloraPrep in a sterile fashion, and a sterile drape was  applied covering the operative field. A sterile gown and sterile gloves were used for the procedure. Local anesthesia was provided with 1% Lidocaine. Under CT guidance, an 18 gauge needle was advanced into the right lower quadrant abscess. This was removed over an Amplatz wire. Ten [place] dilator followed by a 10 [place] drain were inserted. It was looped and string fixed then sewn to the skin. Frank pus was aspirated. FINDINGS: Imaging documents placement of a 10 French drain into a right lower quadrant abscess. IMPRESSION: Successful right lower quadrant abscess drain placement. Electronically Signed   By: Jolaine Click M.D.   On: 05/13/2018 07:57        Scheduled Meds:  aspirin EC  81 mg Oral Daily   diltiazem  120 mg Oral Daily   enoxaparin (LOVENOX) injection  40 mg Subcutaneous Q24H   sodium chloride flush  3 mL Intravenous Once   sodium chloride flush  5 mL Intracatheter Q8H   Continuous Infusions:  sodium chloride 10 mL/hr at 05/13/18 0539   piperacillin-tazobactam (ZOSYN)  IV 3.375 g (05/13/18 0505)     LOS: 2 days    Time spent: 25 minutes    Dorcas Carrow, MD [facility] Pager [phone]  If 7PM-7AM, please contact night-coverage [email or web address] Password [facility] 05/13/2018, 9:47 AM

## 2018-05-13 NOTE — Progress Notes (Signed)
Referring Physician(s): Tsuei,M  Supervising Physician: Simonne Come  Patient Status:  [facility] - In-pt  Chief Complaint: Right lower abdominal pain   Subjective: Pt feeling much better since RLQ drain placed yesterday; eating breakfast now; for transfer to reg floor today   Allergies: Sulfa antibiotics  Medications: Prior to Admission medications   Medication Sig Start Date End Date Taking? Authorizing Provider  ibuprofen (ADVIL,MOTRIN) 200 MG tablet Take 400 mg by mouth every 6 (six) hours as needed for moderate pain.   Yes [provider]     Vital Signs: BP 119/67 (BP Location: Right Arm)    Pulse 71    Temp 98.3 F (36.8 C) (Oral)    Resp 15    Ht 5\' 6"  (1.676 m)    Wt 165 lb (74.8 kg)    SpO2 95%    BMI 26.63 kg/m   Physical Exam RLQ drain intact, dressing dry, site mildly tender, output 225 cc yellow fluid; cx pend  Imaging: Ct Abdomen Pelvis Wo Contrast  Result Date: 05/11/2018 CLINICAL DATA:  Right lower abdominal pain with constipation and emesis EXAM: CT ABDOMEN AND PELVIS WITHOUT CONTRAST TECHNIQUE: Multidetector CT imaging of the abdomen and pelvis was performed following the standard protocol without IV contrast. COMPARISON:  None. FINDINGS: Lower chest: Lung bases demonstrate atelectasis at the right middle lobe and right base. Mild bronchiectasis at the right middle lobe and right base. Trace pericardial effusion. Heart size upper normal. Hepatobiliary: Calcified granuloma. No calcified gallstone or biliary dilatation Pancreas: Unremarkable. No pancreatic ductal dilatation or surrounding inflammatory changes. Spleen: Calcified granuloma Adrenals/Urinary Tract: Adrenal glands are within normal limits. Dilated left extrarenal pelvis versus parapelvic cysts. No hydroureter. No ureteral stone. Bladder is normal Stomach/Bowel: Stomach is nonenlarged. No dilated small bowel. Enlarged appendix measuring up to 15 mm in size with multiple appendicoliths. Significant  right lower quadrant inflammatory changes. Vascular/Lymphatic: Nonaneurysmal aorta. Moderate aortic atherosclerosis. No significantly enlarged lymph nodes Reproductive: Uterine calcification likely a fibroid. No adnexal mass Other: Small foci of extraluminal gas in the right lower quadrant. Gas and fluid collection inferior to the liver measures 7.4 x 5.2 x 3.4 cm. Contiguous with this is an additional right lower quadrant fluid collection measuring 5 x 3.1 x 9 cm. Smaller fluid collections anterior to the transverse colon measuring up to 2.1 cm. Musculoskeletal: Degenerative changes. Grade 1 anterolisthesis L4 on L5. No acute osseous abnormality IMPRESSION: 1. Findings consistent with acute perforated appendicitis. Small extraluminal gas locules in the right lower quadrant. Multiple gas and fluid collections extending from the inferior right hepatic lobe to the right lower quadrant consistent with intra-abdominal abscess. Appendix: Location: Right lower quadrant Diameter: 15 mm Appendicolith: At least 3 appendicoliths Mucosal hyper-enhancement: No contrast given Extraluminal gas: Positive Periappendiceal collection: Positive Critical Value/emergent results were called by telephone at the time of interpretation on 05/11/2018 at 7:09 pm to Dr. Melene Plan , who verbally acknowledged these results. Electronically Signed   By: Jasmine Pang M.D.   On: 05/11/2018 19:09   Ct Image Guided Drainage By Percutaneous Catheter  Result Date: 05/13/2018 INDICATION: Appendicitis and periappendiceal abscess EXAM: CT GUIDED DRAINAGE OF RIGHT LOWER QUADRANT ABSCESS MEDICATIONS: The patient is currently admitted to the hospital and receiving intravenous antibiotics. The antibiotics were administered within an appropriate time frame prior to the initiation of the procedure. ANESTHESIA/SEDATION: One mg IV Versed 50 mcg IV Fentanyl Moderate Sedation Time:  12 minutes The patient was continuously monitored during the procedure by the  interventional  radiology nurse under my direct supervision. COMPLICATIONS: None immediate. TECHNIQUE: Informed written consent was obtained from the patient after a thorough discussion of the procedural risks, benefits and alternatives. All questions were addressed. Maximal Sterile Barrier Technique was utilized including caps, mask, sterile gowns, sterile gloves, sterile drape, hand hygiene and skin antiseptic. A timeout was performed prior to the initiation of the procedure. PROCEDURE: The right lower quadrant was prepped with ChloraPrep in a sterile fashion, and a sterile drape was applied covering the operative field. A sterile gown and sterile gloves were used for the procedure. Local anesthesia was provided with 1% Lidocaine. Under CT guidance, an 18 gauge needle was advanced into the right lower quadrant abscess. This was removed over an Amplatz wire. Ten [place] dilator followed by a 10 [place] drain were inserted. It was looped and string fixed then sewn to the skin. Frank pus was aspirated. FINDINGS: Imaging documents placement of a 10 French drain into a right lower quadrant abscess. IMPRESSION: Successful right lower quadrant abscess drain placement. Electronically Signed   By: Jolaine Click M.D.   On: 05/13/2018 07:57    Labs:  CBC: Recent Labs    05/11/18 1646 05/12/18 0622 05/13/18 0248  WBC 27.8* 26.5* 20.5*  HGB 13.0 11.1* 10.5*  HCT 40.1 33.5* 32.9*  PLT 290 266 261    COAGS: Recent Labs    05/12/18 0622  INR 1.1  APTT 26    BMP: Recent Labs    05/11/18 1646 05/11/18 1723 05/12/18 0622 05/13/18 0248  NA 132*  --  135 137  K 3.3*  --  3.1* 2.9*  CL 93*  --  102 108  CO2 22  --  20* 20*  GLUCOSE 115*  --  104* 117*  BUN 73*  --  64* 46*  CALCIUM 8.6*  --  7.5* 7.5*  CREATININE 2.53* 2.70* 1.95* 1.44*  GFRNONAA 18*  --  25* 36*  GFRAA 21*  --  29* 42*    LIVER FUNCTION TESTS: Recent Labs    05/11/18 1646 05/12/18 0622  BILITOT 0.9 0.5  AST 26 13*  ALT  76* 63*  ALKPHOS 238* 197*  PROT 7.1 6.0*  ALBUMIN 2.8* 2.2*    Assessment and Plan: Pt with hx perf appendicitis and assoc periappendiceal abscess; s/p drain placement 3/19; afebrile; WBC 20.5(26.5), hgb 10.5, K 2.9- replace; creat 1.44(1.95); drain fluid cx pend; hydrate; cont drain irrigation; check f/u CT within 1 week of placement; may need drain inj before considering removal   Electronically Signed: D. Jeananne Rama, PA-C 05/13/2018, 9:46 AM   I spent a total of 15 minutes at the the patient's bedside AND on the patient's hospital floor or unit, greater than 50% of which was counseling/coordinating care for right lower abdominal abscess drain    Patient ID: Monique Castillo, female   DOB: Aug 08, 1945, 73 y.o.   MRN: [id number]

## 2018-05-13 NOTE — Progress Notes (Signed)
Patient ID: Monique Castillo, female   DOB: [birth year], 73 y.o.   MRN: [id number]       Subjective: Patient feels significantly better today after perc drain placed yesterday.  Already on a solid diet and seems to be tolerating this well.  Objective: Vital signs in last 24 hours: Temp:  [97.8 F (36.6 C)-98.3 F (36.8 C)] 98.3 F (36.8 C) (03/20 0400) Pulse Rate:  [62-84] 71 (03/20 0900) Resp:  [13-30] 15 (03/20 0900) BP: (81-130)/(42-76) 119/67 (03/20 0800) SpO2:  [91 %-96 %] 95 % (03/20 0900) Last BM Date: 05/10/18  Intake/Output from previous day: 03/19 0701 - 03/20 0700 In: 3615.2 [P.O.:1703; I.V.:1750.9; IV Piggyback:151.2] Out: 275 [Urine:50; Drains:225] Intake/Output this shift: Total I/O In: 220 [P.O.:220] Out: 300 [Urine:300]  PE: Abd: soft, relatively nontender, drain with no output currently as they just emptied it.  I/O documented 225cc since placement yesterday.  +BS, ND  Lab Results:  Recent Labs    05/12/18 0622 05/13/18 0248  WBC 26.5* 20.5*  HGB 11.1* 10.5*  HCT 33.5* 32.9*  PLT 266 261   BMET Recent Labs    05/12/18 0622 05/13/18 0248  NA 135 137  K 3.1* 2.9*  CL 102 108  CO2 20* 20*  GLUCOSE 104* 117*  BUN 64* 46*  CREATININE 1.95* 1.44*  CALCIUM 7.5* 7.5*   PT/INR Recent Labs    05/12/18 0622  LABPROT 14.5  INR 1.1   CMP     Component Value Date/Time   NA 137 05/13/2018 0248   K 2.9 (L) 05/13/2018 0248   CL 108 05/13/2018 0248   CO2 20 (L) 05/13/2018 0248   GLUCOSE 117 (H) 05/13/2018 0248   BUN 46 (H) 05/13/2018 0248   CREATININE 1.44 (H) 05/13/2018 0248   CALCIUM 7.5 (L) 05/13/2018 0248   PROT 6.0 (L) 05/12/2018 0622   ALBUMIN 2.2 (L) 05/12/2018 0622   AST 13 (L) 05/12/2018 0622   ALT 63 (H) 05/12/2018 0622   ALKPHOS 197 (H) 05/12/2018 0622   BILITOT 0.5 05/12/2018 0622   GFRNONAA 36 (L) 05/13/2018 0248   GFRAA 42 (L) 05/13/2018 0248   Lipase     Component Value Date/Time   LIPASE 36 05/11/2018 1646        Studies/Results: Ct Abdomen Pelvis Wo Contrast  Result Date: 05/11/2018 CLINICAL DATA:  Right lower abdominal pain with constipation and emesis EXAM: CT ABDOMEN AND PELVIS WITHOUT CONTRAST TECHNIQUE: Multidetector CT imaging of the abdomen and pelvis was performed following the standard protocol without IV contrast. COMPARISON:  None. FINDINGS: Lower chest: Lung bases demonstrate atelectasis at the right middle lobe and right base. Mild bronchiectasis at the right middle lobe and right base. Trace pericardial effusion. Heart size upper normal. Hepatobiliary: Calcified granuloma. No calcified gallstone or biliary dilatation Pancreas: Unremarkable. No pancreatic ductal dilatation or surrounding inflammatory changes. Spleen: Calcified granuloma Adrenals/Urinary Tract: Adrenal glands are within normal limits. Dilated left extrarenal pelvis versus parapelvic cysts. No hydroureter. No ureteral stone. Bladder is normal Stomach/Bowel: Stomach is nonenlarged. No dilated small bowel. Enlarged appendix measuring up to 15 mm in size with multiple appendicoliths. Significant right lower quadrant inflammatory changes. Vascular/Lymphatic: Nonaneurysmal aorta. Moderate aortic atherosclerosis. No significantly enlarged lymph nodes Reproductive: Uterine calcification likely a fibroid. No adnexal mass Other: Small foci of extraluminal gas in the right lower quadrant. Gas and fluid collection inferior to the liver measures 7.4 x 5.2 x 3.4 cm. Contiguous with this is an additional right lower quadrant fluid collection measuring 5 x 3.1  x 9 cm. Smaller fluid collections anterior to the transverse colon measuring up to 2.1 cm. Musculoskeletal: Degenerative changes. Grade 1 anterolisthesis L4 on L5. No acute osseous abnormality IMPRESSION: 1. Findings consistent with acute perforated appendicitis. Small extraluminal gas locules in the right lower quadrant. Multiple gas and fluid collections extending from the inferior right  hepatic lobe to the right lower quadrant consistent with intra-abdominal abscess. Appendix: Location: Right lower quadrant Diameter: 15 mm Appendicolith: At least 3 appendicoliths Mucosal hyper-enhancement: No contrast given Extraluminal gas: Positive Periappendiceal collection: Positive Critical Value/emergent results were called by telephone at the time of interpretation on 05/11/2018 at 7:09 pm to Dr. Melene Plan , who verbally acknowledged these results. Electronically Signed   By: Jasmine Pang M.D.   On: 05/11/2018 19:09   Ct Image Guided Drainage By Percutaneous Catheter  Result Date: 05/13/2018 INDICATION: Appendicitis and periappendiceal abscess EXAM: CT GUIDED DRAINAGE OF RIGHT LOWER QUADRANT ABSCESS MEDICATIONS: The patient is currently admitted to the hospital and receiving intravenous antibiotics. The antibiotics were administered within an appropriate time frame prior to the initiation of the procedure. ANESTHESIA/SEDATION: One mg IV Versed 50 mcg IV Fentanyl Moderate Sedation Time:  12 minutes The patient was continuously monitored during the procedure by the interventional radiology nurse under my direct supervision. COMPLICATIONS: None immediate. TECHNIQUE: Informed written consent was obtained from the patient after a thorough discussion of the procedural risks, benefits and alternatives. All questions were addressed. Maximal Sterile Barrier Technique was utilized including caps, mask, sterile gowns, sterile gloves, sterile drape, hand hygiene and skin antiseptic. A timeout was performed prior to the initiation of the procedure. PROCEDURE: The right lower quadrant was prepped with ChloraPrep in a sterile fashion, and a sterile drape was applied covering the operative field. A sterile gown and sterile gloves were used for the procedure. Local anesthesia was provided with 1% Lidocaine. Under CT guidance, an 18 gauge needle was advanced into the right lower quadrant abscess. This was removed over an  Amplatz wire. Ten [place] dilator followed by a 10 [place] drain were inserted. It was looped and string fixed then sewn to the skin. Frank pus was aspirated. FINDINGS: Imaging documents placement of a 10 French drain into a right lower quadrant abscess. IMPRESSION: Successful right lower quadrant abscess drain placement. Electronically Signed   By: Jolaine Click M.D.   On: 05/13/2018 07:57    Anti-infectives: Anti-infectives (From admission, onward)   Start     Dose/Rate Route Frequency Ordered Stop   05/12/18 1300  piperacillin-tazobactam (ZOSYN) IVPB 3.375 g     3.375 g 12.5 mL/hr over 240 Minutes Intravenous Every 8 hours 05/12/18 0805     05/12/18 0615  piperacillin-tazobactam (ZOSYN) IVPB 3.375 g  Status:  Discontinued     3.375 g 100 mL/hr over 30 Minutes Intravenous Every 8 hours 05/12/18 0600 05/12/18 0611   05/12/18 0600  piperacillin-tazobactam (ZOSYN) IVPB 2.25 g  Status:  Discontinued     2.25 g 100 mL/hr over 30 Minutes Intravenous Every 8 hours 05/12/18 0443 05/12/18 0804   05/11/18 1915  piperacillin-tazobactam (ZOSYN) IVPB 3.375 g     3.375 g 100 mL/hr over 30 Minutes Intravenous  Once 05/11/18 1907 05/11/18 2013       Assessment/Plan Atrial fibrillation with RVR Hyponatremia/hypokalemia Acute kidney injury Dehydration Probable UTI  Acute perforated appendicitis with 2 large intra-abdominal abscesses -perc drain in one collection 3/19 -WBC down to 20K -cont regular diet at this point as she is already tolerating this -will need  repeat CT scan in about a week to re-evaluate collection drain is in and multiple other fluid collections -cont IV abx therapy for now -mobilize and pulm toilet  FEN: IV fluids/regular diet ID: Zosyn 3/18 >>  DVT: SCDs/ Lovenox Follow-up: To be determined   LOS: 2 days    Letha Cape , [facility] 05/13/2018, 9:24 AM Pager: [phone]

## 2018-05-13 NOTE — Progress Notes (Signed)
Gave report to [facility] on 5th floor. Patient and husband is aware of transport. Pt. Is in No distress.

## 2018-05-13 NOTE — Progress Notes (Signed)
This RN agrees with the previous assessment.  

## 2018-05-14 LAB — CBC WITH DIFFERENTIAL/PLATELET
Abs Immature Granulocytes: 1.64 K/uL — ABNORMAL HIGH (ref 0.00–0.07)
Basophils Absolute: 0.2 K/uL — ABNORMAL HIGH (ref 0.0–0.1)
Basophils Relative: 1 %
Eosinophils Absolute: 0.2 K/uL (ref 0.0–0.5)
Eosinophils Relative: 1 %
HCT: 36.6 % (ref 36.0–46.0)
Hemoglobin: 11.3 g/dL — ABNORMAL LOW (ref 12.0–15.0)
Immature Granulocytes: 9 %
Lymphocytes Relative: 9 %
Lymphs Abs: 1.6 K/uL (ref 0.7–4.0)
MCH: 29.7 pg (ref 26.0–34.0)
MCHC: 30.9 g/dL (ref 30.0–36.0)
MCV: 96.3 fL (ref 80.0–100.0)
Monocytes Absolute: 1.2 K/uL — ABNORMAL HIGH (ref 0.1–1.0)
Monocytes Relative: 7 %
Neutro Abs: 13.4 K/uL — ABNORMAL HIGH (ref 1.7–7.7)
Neutrophils Relative %: 73 %
Platelets: 344 K/uL (ref 150–400)
RBC: 3.8 MIL/uL — ABNORMAL LOW (ref 3.87–5.11)
RDW: 14.4 % (ref 11.5–15.5)
WBC Morphology: INCREASED
WBC: 18.2 K/uL — ABNORMAL HIGH (ref 4.0–10.5)
nRBC: 0 % (ref 0.0–0.2)

## 2018-05-14 LAB — BASIC METABOLIC PANEL
Anion gap: 8 (ref 5–15)
BUN: 26 mg/dL — ABNORMAL HIGH (ref 8–23)
CO2: 23 mmol/L (ref 22–32)
Calcium: 8.4 mg/dL — ABNORMAL LOW (ref 8.9–10.3)
Chloride: 110 mmol/L (ref 98–111)
Creatinine, Ser: 1 mg/dL (ref 0.44–1.00)
GFR calc Af Amer: >60 mL/min (ref >60)
GFR, EST NON AFRICAN AMERICAN: 57 mL/min — AB (ref >60)
Glucose, Bld: 106 mg/dL — ABNORMAL HIGH (ref 70–99)
Potassium: 3.9 mmol/L (ref 3.5–5.1)
Sodium: 141 mmol/L (ref 135–145)

## 2018-05-14 MED ORDER — HYDRALAZINE HCL 20 MG/ML IJ SOLN
10.0000 mg | Freq: Four times a day (QID) | INTRAMUSCULAR | Status: DC | PRN
  Administered 2018-05-14: 10 mg via INTRAVENOUS
  Filled 2018-05-14: qty 1

## 2018-05-14 MED ORDER — TRAZODONE HCL 50 MG PO TABS
50.0000 mg | ORAL_TABLET | Freq: Every evening | ORAL | Status: DC | PRN
  Administered 2018-05-14 – 2018-05-16 (×3): 50 mg via ORAL
  Filled 2018-05-14 (×3): qty 1

## 2018-05-14 MED ORDER — SODIUM CHLORIDE 0.9 % IV SOLN
2.0000 g | INTRAVENOUS | Status: AC
  Administered 2018-05-14 – 2018-05-15 (×2): 2 g via INTRAVENOUS
  Filled 2018-05-14 (×2): qty 2

## 2018-05-14 NOTE — Progress Notes (Signed)
Referring Physician(s): Tsuei,M  Supervising Physician: Oley Balm  Patient Status:  [name]  Chief Complaint: Right lower abdominal pain   Subjective: Pt continues to do well; has some mild RLQ soreness; denies N/V   Allergies: Sulfa antibiotics  Medications: Prior to Admission medications   Medication Sig Start Date End Date Taking? Authorizing Provider  ibuprofen (ADVIL,MOTRIN) 200 MG tablet Take 400 mg by mouth every 6 (six) hours as needed for moderate pain.   Yes [provider]     Vital Signs: BP (!) 160/89 (BP Location: Right Arm)    Pulse 75    Temp 98.4 F (36.9 C)    Resp 20    Ht 5\' 6"  (1.676 m)    Wt 165 lb (74.8 kg)    SpO2 95%    BMI 26.63 kg/m   Physical Exam RLQ drain intact, insertion site ok, mildly tender, output 10 cc yellow fluid; cx- e coli  Imaging: Ct Abdomen Pelvis Wo Contrast  Result Date: 05/11/2018 CLINICAL DATA:  Right lower abdominal pain with constipation and emesis EXAM: CT ABDOMEN AND PELVIS WITHOUT CONTRAST TECHNIQUE: Multidetector CT imaging of the abdomen and pelvis was performed following the standard protocol without IV contrast. COMPARISON:  None. FINDINGS: Lower chest: Lung bases demonstrate atelectasis at the right middle lobe and right base. Mild bronchiectasis at the right middle lobe and right base. Trace pericardial effusion. Heart size upper normal. Hepatobiliary: Calcified granuloma. No calcified gallstone or biliary dilatation Pancreas: Unremarkable. No pancreatic ductal dilatation or surrounding inflammatory changes. Spleen: Calcified granuloma Adrenals/Urinary Tract: Adrenal glands are within normal limits. Dilated left extrarenal pelvis versus parapelvic cysts. No hydroureter. No ureteral stone. Bladder is normal Stomach/Bowel: Stomach is nonenlarged. No dilated small bowel. Enlarged appendix measuring up to 15 mm in size with multiple appendicoliths. Significant right lower quadrant inflammatory changes.  Vascular/Lymphatic: Nonaneurysmal aorta. Moderate aortic atherosclerosis. No significantly enlarged lymph nodes Reproductive: Uterine calcification likely a fibroid. No adnexal mass Other: Small foci of extraluminal gas in the right lower quadrant. Gas and fluid collection inferior to the liver measures 7.4 x 5.2 x 3.4 cm. Contiguous with this is an additional right lower quadrant fluid collection measuring 5 x 3.1 x 9 cm. Smaller fluid collections anterior to the transverse colon measuring up to 2.1 cm. Musculoskeletal: Degenerative changes. Grade 1 anterolisthesis L4 on L5. No acute osseous abnormality IMPRESSION: 1. Findings consistent with acute perforated appendicitis. Small extraluminal gas locules in the right lower quadrant. Multiple gas and fluid collections extending from the inferior right hepatic lobe to the right lower quadrant consistent with intra-abdominal abscess. Appendix: Location: Right lower quadrant Diameter: 15 mm Appendicolith: At least 3 appendicoliths Mucosal hyper-enhancement: No contrast given Extraluminal gas: Positive Periappendiceal collection: Positive Critical Value/emergent results were called by telephone at the time of interpretation on 05/11/2018 at 7:09 pm to Dr. Melene Plan , who verbally acknowledged these results. Electronically Signed   By: Monique Castillo M.D.   On: 05/11/2018 19:09   Ct Image Guided Drainage By Percutaneous Catheter  Result Date: 05/13/2018 INDICATION: Appendicitis and periappendiceal abscess EXAM: CT GUIDED DRAINAGE OF RIGHT LOWER QUADRANT ABSCESS MEDICATIONS: The patient is currently admitted to the hospital and receiving intravenous antibiotics. The antibiotics were administered within an appropriate time frame prior to the initiation of the procedure. ANESTHESIA/SEDATION: One mg IV Versed 50 mcg IV Fentanyl Moderate Sedation Time:  12 minutes The patient was continuously monitored during the procedure by the interventional radiology nurse under my  direct  supervision. COMPLICATIONS: None immediate. TECHNIQUE: Informed written consent was obtained from the patient after a thorough discussion of the procedural risks, benefits and alternatives. All questions were addressed. Maximal Sterile Barrier Technique was utilized including caps, mask, sterile gowns, sterile gloves, sterile drape, hand hygiene and skin antiseptic. A timeout was performed prior to the initiation of the procedure. PROCEDURE: The right lower quadrant was prepped with ChloraPrep in a sterile fashion, and a sterile drape was applied covering the operative field. A sterile gown and sterile gloves were used for the procedure. Local anesthesia was provided with 1% Lidocaine. Under CT guidance, an 18 gauge needle was advanced into the right lower quadrant abscess. This was removed over an Amplatz wire. Ten [place] dilator followed by a 10 [place] drain were inserted. It was looped and string fixed then sewn to the skin. Frank pus was aspirated. FINDINGS: Imaging documents placement of a 10 French drain into a right lower quadrant abscess. IMPRESSION: Successful right lower quadrant abscess drain placement. Electronically Signed   By: Monique Castillo M.D.   On: 05/13/2018 07:57    Labs:  CBC: Recent Labs    05/11/18 1646 05/12/18 0622 05/13/18 0248 05/14/18 0601  WBC 27.8* 26.5* 20.5* 18.2*  HGB 13.0 11.1* 10.5* 11.3*  HCT 40.1 33.5* 32.9* 36.6  PLT 290 266 261 344    COAGS: Recent Labs    05/12/18 0622  INR 1.1  APTT 26    BMP: Recent Labs    05/11/18 1646 05/11/18 1723 05/12/18 0622 05/13/18 0248 05/14/18 0601  NA 132*  --  135 137 141  K 3.3*  --  3.1* 2.9* 3.9  CL 93*  --  102 108 110  CO2 22  --  20* 20* 23  GLUCOSE 115*  --  104* 117* 106*  BUN 73*  --  64* 46* 26*  CALCIUM 8.6*  --  7.5* 7.5* 8.4*  CREATININE 2.53* 2.70* 1.95* 1.44* 1.00  GFRNONAA 18*  --  25* 36* 57*  GFRAA 21*  --  29* 42* >60    LIVER FUNCTION TESTS: Recent Labs    05/11/18 1646  05/12/18 0622  BILITOT 0.9 0.5  AST 26 13*  ALT 76* 63*  ALKPHOS 238* 197*  PROT 7.1 6.0*  ALBUMIN 2.8* 2.2*    Assessment and Plan: Pt with hx perf appendicitis and assoc periappendiceal abscess; s/p drain placement 3/19; afebrile; WBC 18.2(20.5), hgb 11.3(10.5), creat nl; drain fluid cx - E coli pan sensitive; cont drain irrigation; check f/u CT next week esp if output minimal; may need drain inj before considering removal   Electronically Signed: D. Jeananne Rama, PA-C 05/14/2018, 1:05 PM   I spent a total of 15 minutes at the the patient's bedside AND on the patient's hospital floor or unit, greater than 50% of which was counseling/coordinating care for right lower abdominal abscess drain     Patient ID: Monique Castillo, female   DOB: [birth year], 73 y.o.   MRN: [id number]

## 2018-05-14 NOTE — Progress Notes (Signed)
PROGRESS NOTE    Monique Castillo  ZOX:[id number] DOB: 1946/01/03 DOA: 05/11/2018 PCP: Patient, [name]    Brief Narrative:  Patient is 73 year old female who has never gone to a doctor recently.  No known chronic medical problems.  No recent medical follow-up.  She had been having some abdominal pain for about a week.  Pain was worsening so came to ER.  She was found to have ruptured appendicular abscess and rapid A. fib.  Also found to have renal insufficiency.  Assessment & Plan:   Active Problems:   Appendicitis   Hyponatremia   Hypokalemia   Dehydration   Sepsis (HCC)   Intraabdominal fluid collection   AKI (acute kidney injury) (HCC)   Atrial fibrillation with RVR (HCC)  Ruptured appendicular abscess: Clinically stabilizing.  Status post percutaneous drain.  On regular diet.  On IV Zosyn.  Peritoneal cultures pending.  Was both gram-positive and gram-negative cocci.  Followed by surgery.  Ambulate and advance activities.   Paroxysmal A. fib with RVR: Unknown whether new or old.  Treated with IV Cardizem and rate controlled.  Currently rate controlled in sinus rhythm.  Will change to long-acting Cardizem.  TSH normal.  Echocardiogram with normal ejection fraction.    Unadjusted stroke rate    CHADVASC 2 score  0 points: 0.2% per year  1 point: 0.6% per year  2 points: 2.2% per year  3 points: 3.2% per year  4 points: 4.8% per year  5 points: 7.2% per year  6 points: 9.7% per year  7 points: 11.2% per year  8 points: 10.8% per year  9 points: 12.2% per year    HAS-BLED score interpretation     0 points: 1.13 bleeds per 100 patient-years  1 point: 1.02 bleeds per 100 patient-years  2 points: 1.88 bleeds per 100 patient-years  3 points: 3.74 bleeds per 100 patient-years  4 points: 8.70 bleeds per 100 patient-years  5 to 9 points: Insufficient data    Patient will benefit with therapeutic anticoagulation.  We discussed in detail about choices including  different novel anticoagulant therapies.  After careful discussion, patient really does not want to start any blood thinners.  Patient is also not very optimistic whether she will continue to take even aspirin.  I advised her at least to consider aspirin as suboptimal therapeutic options and she agreed.  Will start patient on aspirin 81 mg daily.  Acute renal failure:  Normalized.  Hypokalemia: Replaced.  Persistently low.  Will replace and monitor levels.  DVT prophylaxis: SCDs.,  Lovenox. Code Status: Full code. Family Communication: Husband at the bedside. Disposition Plan: Home when is stable.  Consultants:   General surgery  Interventional radiology  Procedures:   CT-guided abdominal drain  Antimicrobials:   Continue with IV Zosyn 05/11/2018   Subjective: patient was seen and examined.  Has minimum abdominal pain.  Minimal abdominal drain.  Objective: Vitals:   05/13/18 0900 05/13/18 1314 05/13/18 2118 05/14/18 0431  BP:  132/81 (!) 170/92 (!) 160/89  Pulse: 71 71 79 75  Resp: Temp:  (!) 97.3 F (36.3 C) 97.8 F (36.6 C) 98.4 F (36.9 C)  TempSrc:  Oral Oral   SpO2: 95% 97% 94% 95%  Weight:      Height:        Intake/Output Summary (Last 24 hours) at 05/14/2018 1148 Last data filed at 05/14/2018 0930 Gross per 24 hour  Intake 410 ml  Output 10 ml  Net 400 ml   Filed Weights   05/11/18 1640  Weight: 74.8 kg    Examination:  General exam: Appears calm and comfortable  Respiratory system: Clear to auscultation. Respiratory effort normal.  Cardiovascular system: S1 & S2 heard, RRR. No JVD, murmurs, rubs, gallops or clicks. No pedal edema. Gastrointestinal system: Abdomen is nondistended, soft and she has diffuse moderate tenderness along the right lower quadrant with percutaneous drain in place.  No organomegaly or masses felt. Normal bowel sounds heard. Central nervous system: Alert and oriented. No focal neurological deficits. Extremities:  Symmetric 5 x 5 power. Skin: No rashes, lesions or ulcers Psychiatry: Judgement and insight appear normal. Mood & affect appropriate.     Data Reviewed: I have personally reviewed following labs and imaging studies  CBC: Recent Labs  Lab 05/11/18 1646 05/12/18 0622 05/13/18 0248 05/14/18 0601  WBC 27.8* 26.5* 20.5* 18.2*  NEUTROABS  --   --  15.8* 13.4*  HGB 13.0 11.1* 10.5* 11.3*  HCT 40.1 33.5* 32.9* 36.6  MCV 93.7 92.5 95.4 96.3  PLT 290 266 261 344   Basic Metabolic Panel: Recent Labs  Lab 05/11/18 1646 05/11/18 1723 05/12/18 0622 05/13/18 0248 05/14/18 0601  NA 132*  --  135 137 141  K 3.3*  --  3.1* 2.9* 3.9  CL 93*  --  102 108 110  CO2 22  --  20* 20* 23  GLUCOSE 115*  --  104* 117* 106*  BUN 73*  --  64* 46* 26*  CREATININE 2.53* 2.70* 1.95* 1.44* 1.00  CALCIUM 8.6*  --  7.5* 7.5* 8.4*  MG 2.7*  --  2.4  --   --   PHOS  --   --  3.6  --   --    GFR: Estimated Creatinine Clearance: 53.4 mL/min (by C-G formula based on SCr of 1 mg/dL). Liver Function Tests: Recent Labs  Lab 05/11/18 1646 05/12/18 0622  AST 26 13*  ALT 76* 63*  ALKPHOS 238* 197*  BILITOT 0.9 0.5  PROT 7.1 6.0*  ALBUMIN 2.8* 2.2*   Recent Labs  Lab 05/11/18 1646  LIPASE 36   No results for input(s): AMMONIA in the last 168 hours. Coagulation Profile: Recent Labs  Lab 05/12/18 0622  INR 1.1   Cardiac Enzymes: No results for input(s): CKTOTAL, CKMB, CKMBINDEX, TROPONINI in the last 168 hours. BNP (last 3 results) No results for input(s): PROBNP in the last 8760 hours. HbA1C: Recent Labs    05/13/18 0248  HGBA1C 5.8*   CBG: No results for input(s): GLUCAP in the last 168 hours. Lipid Profile: No results for input(s): CHOL, HDL, LDLCALC, TRIG, CHOLHDL, LDLDIRECT in the last 72 hours. Thyroid Function Tests: Recent Labs    05/12/18 0622  TSH 3.263   Anemia Panel: No results for input(s): VITAMINB12, FOLATE, FERRITIN, TIBC, IRON, RETICCTPCT in the last 72  hours. Sepsis Labs: Recent Labs  Lab 05/11/18 2017 05/12/18 0622  PROCALCITON  --  7.35  LATICACIDVEN 0.9 0.7    Recent Results (from the past 240 hour(s))  Culture, Urine     Status: None   Collection Time: 05/11/18  6:47 PM  Result Value Ref Range Status   Specimen Description   Final    URINE, CLEAN CATCH Performed at [facility], [address], [place], [place] [address]    Special Requests   Final    NONE Performed at [facility], [address] [place], [place] [address]  Culture   Final    Multiple bacterial morphotypes present, none predominant. Suggest appropriate recollection if clinically indicated.   Report Status 05/13/2018 FINAL  Final  Surgical pcr screen     Status: None   Collection Time: 05/12/18  4:11 AM  Result Value Ref Range Status   MRSA, PCR NEGATIVE NEGATIVE Final   Staphylococcus aureus NEGATIVE NEGATIVE Final    Comment: (NOTE) The Xpert SA Assay (FDA approved for NASAL specimens in patients 88 years of age and older), is one component of a comprehensive surveillance program. It is not intended to diagnose infection nor to guide or monitor treatment. Performed at [facility], [address], [place], [place] [address]   Culture, blood (x 2)     Status: None (Preliminary result)   Collection Time: 05/12/18  6:22 AM  Result Value Ref Range Status   Specimen Description   Final    BLOOD LEFT HAND Performed at [facility], [address], [place], [place] [address]    Special Requests   Final    BOTTLES DRAWN AEROBIC AND ANAEROBIC Blood Culture adequate volume Performed at [facility], [address], [place], [place] [address]    Culture   Final    NO GROWTH 2 DAYS Performed at [facility], [address] [place], [place] [address]    Report Status PENDING  Incomplete  Culture, blood (x 2)     Status: None (Preliminary result)    Collection Time: 05/12/18  6:27 AM  Result Value Ref Range Status   Specimen Description   Final    BLOOD RIGHT ANTECUBITAL Performed at [facility], [address] [place], [place] [address]    Special Requests   Final    BOTTLES DRAWN AEROBIC AND ANAEROBIC Blood Culture results may not be optimal due to an excessive volume of blood received in culture bottles Performed at [facility], [address], [place], [place] [address]    Culture   Final    NO GROWTH 2 DAYS Performed at [facility], [address], [place], [place] [address]    Report Status PENDING  Incomplete  Aerobic/Anaerobic Culture (surgical/deep wound)     Status: None (Preliminary result)   Collection Time: 05/12/18  5:34 PM  Result Value Ref Range Status   Specimen Description   Final    ABDOMEN Performed at [facility], [address] [place], [place] [address]    Special Requests   Final    NONE Performed at [facility], [address] [place], [place] [address]    Gram Stain   Final    ABUNDANT WBC PRESENT,BOTH PMN AND MONONUCLEAR GRAM POSITIVE COCCI IN PAIRS IN CLUSTERS ABUNDANT GRAM NEGATIVE RODS    Culture   Final    MODERATE GRAM NEGATIVE RODS IDENTIFICATION AND SUSCEPTIBILITIES TO FOLLOW CULTURE REINCUBATED FOR BETTER GROWTH Performed at [facility], [address], [place], [place] [address]    Report Status PENDING  Incomplete  MRSA PCR Screening     Status: None   Collection Time: 05/12/18  6:04 PM  Result Value Ref Range Status   MRSA by PCR NEGATIVE NEGATIVE Final    Comment:        The GeneXpert MRSA Assay (FDA approved for NASAL specimens only), is one component of a comprehensive MRSA colonization surveillance program. It is not intended to diagnose MRSA infection nor  to guide or monitor treatment for MRSA infections. Performed at [facility], [address]  [place], [place] [address]          Radiology Studies: Ct Image Guided Drainage By Percutaneous Catheter  Result Date: 05/13/2018 INDICATION: Appendicitis and periappendiceal abscess EXAM: CT GUIDED DRAINAGE OF RIGHT LOWER QUADRANT ABSCESS MEDICATIONS: The patient is currently admitted to the hospital and receiving intravenous antibiotics. The antibiotics were administered within an appropriate time frame prior to the initiation of the procedure. ANESTHESIA/SEDATION: One mg IV Versed 50 mcg IV Fentanyl Moderate Sedation Time:  12 minutes The patient was continuously monitored during the procedure by the interventional radiology nurse under my direct supervision. COMPLICATIONS: None immediate. TECHNIQUE: Informed written consent was obtained from the patient after a thorough discussion of the procedural risks, benefits and alternatives. All questions were addressed. Maximal Sterile Barrier Technique was utilized including caps, mask, sterile gowns, sterile gloves, sterile drape, hand hygiene and skin antiseptic. A timeout was performed prior to the initiation of the procedure. PROCEDURE: The right lower quadrant was prepped with ChloraPrep in a sterile fashion, and a sterile drape was applied covering the operative field. A sterile gown and sterile gloves were used for the procedure. Local anesthesia was provided with 1% Lidocaine. Under CT guidance, an 18 gauge needle was advanced into the right lower quadrant abscess. This was removed over an Amplatz wire. Ten [place] dilator followed by a 10 [place] drain were inserted. It was looped and string fixed then sewn to the skin. Frank pus was aspirated. FINDINGS: Imaging documents placement of a 10 French drain into a right lower quadrant abscess. IMPRESSION: Successful right lower quadrant abscess drain placement. Electronically Signed   By: Jolaine Click M.D.   On: 05/13/2018 07:57        Scheduled Meds:  aspirin EC  81 mg Oral Daily   diltiazem  120 mg  Oral Daily   enoxaparin (LOVENOX) injection  40 mg Subcutaneous Q24H   potassium chloride  40 mEq Oral BID   sodium chloride flush  3 mL Intravenous Once   sodium chloride flush  5 mL Intracatheter Q8H   Continuous Infusions:  sodium chloride 10 mL/hr at 05/13/18 0539   piperacillin-tazobactam (ZOSYN)  IV 3.375 g (05/14/18 0550)     LOS: 3 days    Time spent: 25 minutes    Dorcas Carrow, MD [facility] Pager [phone]  If 7PM-7AM, please contact night-coverage [email or web address] Password [facility] 05/14/2018, 11:48 AM

## 2018-05-14 NOTE — Progress Notes (Signed)
Subjective: Feeling better.  Less pain.  Advancing diet.  Asking when she can go home. 1 stool. Afebrile. WBC down to 18,200.   Objective: Vital signs in last 24 hours: Temp:  [97.3 F (36.3 C)-98.4 F (36.9 C)] 98.4 F (36.9 C) (03/21 0431) Pulse Rate:  [71-79] 75 (03/21 0431) Resp:  [20] 20 (03/21 0431) BP: (132-170)/(81-92) 160/89 (03/21 0431) SpO2:  [94 %-97 %] 95 % (03/21 0431) Last BM Date: 05/10/18  Intake/Output from previous day: 03/20 0701 - 03/21 0700 In: 390 [P.O.:380] Out: 410 [Urine:400; Drains:10] Intake/Output this shift: No intake/output data recorded.   PE: General: Alert.  Does not appear to be in any distress Lungs: Clear to auscultation bilaterally Abd: soft, relatively nontender, drain output now minimal.  +BS, ND   Lab Results:  Recent Labs    05/13/18 0248 05/14/18 0601  WBC 20.5* 18.2*  HGB 10.5* 11.3*  HCT 32.9* 36.6  PLT 261 344   BMET Recent Labs    05/13/18 0248 05/14/18 0601  NA 137 141  K 2.9* 3.9  CL 108 110  CO2 20* 23  GLUCOSE 117* 106*  BUN 46* 26*  CREATININE 1.44* 1.00  CALCIUM 7.5* 8.4*   PT/INR Recent Labs    05/12/18 0622  LABPROT 14.5  INR 1.1   ABG No results for input(s): PHART, HCO3 in the last 72 hours.  Invalid input(s): PCO2, PO2  Studies/Results: Ct Image Guided Drainage By Percutaneous Catheter  Result Date: 05/13/2018 INDICATION: Appendicitis and periappendiceal abscess EXAM: CT GUIDED DRAINAGE OF RIGHT LOWER QUADRANT ABSCESS MEDICATIONS: The patient is currently admitted to the hospital and receiving intravenous antibiotics. The antibiotics were administered within an appropriate time frame prior to the initiation of the procedure. ANESTHESIA/SEDATION: One mg IV Versed 50 mcg IV Fentanyl Moderate Sedation Time:  12 minutes The patient was continuously monitored during the procedure by the interventional radiology nurse under my direct supervision. COMPLICATIONS: None immediate. TECHNIQUE:  Informed written consent was obtained from the patient after a thorough discussion of the procedural risks, benefits and alternatives. All questions were addressed. Maximal Sterile Barrier Technique was utilized including caps, mask, sterile gowns, sterile gloves, sterile drape, hand hygiene and skin antiseptic. A timeout was performed prior to the initiation of the procedure. PROCEDURE: The right lower quadrant was prepped with ChloraPrep in a sterile fashion, and a sterile drape was applied covering the operative field. A sterile gown and sterile gloves were used for the procedure. Local anesthesia was provided with 1% Lidocaine. Under CT guidance, an 18 gauge needle was advanced into the right lower quadrant abscess. This was removed over an Amplatz wire. Ten [place] dilator followed by a 10 [place] drain were inserted. It was looped and string fixed then sewn to the skin. Frank pus was aspirated. FINDINGS: Imaging documents placement of a 10 French drain into a right lower quadrant abscess. IMPRESSION: Successful right lower quadrant abscess drain placement. Electronically Signed   By: Jolaine Click M.D.   On: 05/13/2018 07:57    Anti-infectives: Anti-infectives (From admission, onward)   Start     Dose/Rate Route Frequency Ordered Stop   05/12/18 1300  piperacillin-tazobactam (ZOSYN) IVPB 3.375 g     3.375 g 12.5 mL/hr over 240 Minutes Intravenous Every 8 hours 05/12/18 0805     05/12/18 0615  piperacillin-tazobactam (ZOSYN) IVPB 3.375 g  Status:  Discontinued     3.375 g 100 mL/hr over 30 Minutes Intravenous Every 8 hours 05/12/18 0600 05/12/18 0611   05/12/18 0600  piperacillin-tazobactam (ZOSYN) IVPB 2.25 g  Status:  Discontinued     2.25 g 100 mL/hr over 30 Minutes Intravenous Every 8 hours 05/12/18 0443 05/12/18 0804   05/11/18 1915  piperacillin-tazobactam (ZOSYN) IVPB 3.375 g     3.375 g 100 mL/hr over 30 Minutes Intravenous  Once 05/11/18 1907 05/11/18 2013       Assessment/Plan:  Acute appendicitis with perforation and large intra-abdominal abscess.  Leukocytosis slowly improving following percutaneous drainage Clinically rapidly improving Diet as tolerated Continue IV antibiotic therapy Ambulate Check CBC in am Follow-up CT if leukocytosis fails to resolve   Atrial fibrillation with RVR Hyponatremia/hypokalemia Acute kidney injury Dehydration Probable UTI    LOS: 3 days    Ernestene Mention 05/14/2018

## 2018-05-15 LAB — CBC
HCT: 36.8 % (ref 36.0–46.0)
Hemoglobin: 11.9 g/dL — ABNORMAL LOW (ref 12.0–15.0)
MCH: 30.5 pg (ref 26.0–34.0)
MCHC: 32.3 g/dL (ref 30.0–36.0)
MCV: 94.4 fL (ref 80.0–100.0)
PLATELETS: 339 10*3/uL (ref 150–400)
RBC: 3.9 MIL/uL (ref 3.87–5.11)
RDW: 14.6 % (ref 11.5–15.5)
WBC: 15.7 10*3/uL — ABNORMAL HIGH (ref 4.0–10.5)
nRBC: 0 % (ref 0.0–0.2)

## 2018-05-15 MED ORDER — DILTIAZEM HCL ER COATED BEADS 240 MG PO CP24
240.0000 mg | ORAL_CAPSULE | Freq: Every day | ORAL | Status: DC
  Administered 2018-05-15 – 2018-05-17 (×3): 240 mg via ORAL
  Filled 2018-05-15 (×3): qty 1

## 2018-05-15 MED ORDER — LEVOFLOXACIN 750 MG PO TABS
750.0000 mg | ORAL_TABLET | ORAL | Status: DC
  Administered 2018-05-15 – 2018-05-16 (×2): 750 mg via ORAL
  Filled 2018-05-15 (×2): qty 1

## 2018-05-15 NOTE — Progress Notes (Signed)
Progress Note: General Surgery Service   Assessment/Plan: Active Problems:   Appendicitis   Hyponatremia   Hypokalemia   Dehydration   Sepsis (HCC)   Intraabdominal fluid collection   AKI (acute kidney injury) (HCC)   Atrial fibrillation with RVR (HCC)  s/p    Acute appendicitis with perforation and large intra-abdominal abscess.  Leukocytosis slowly improving following percutaneous drainage Clinically rapidly improving Diet as tolerated Continue IV antibiotic therapy Ambulate Check CBC in am  Atrial fibrillation with RVR Hyponatremia/hypokalemia Acute kidney injury Dehydration Probable UTI   LOS: 4 days  Chief Complaint/Subjective: Pain much improved, no nausea, tolerating diet  Objective: Vital signs in last 24 hours: Temp:  [97.9 F (36.6 C)-98.3 F (36.8 C)] 97.9 F (36.6 C) (03/22 0510) Pulse Rate:  [73-83] 83 (03/22 0510) Resp:  [16-19] 19 (03/22 0510) BP: (163-179)/(82-98) 167/97 (03/22 0510) SpO2:  [97 %-100 %] 97 % (03/22 0510) Last BM Date: 05/10/18  Intake/Output from previous day: 03/21 0701 - 03/22 0700 In: 340 [P.O.:240; IV Piggyback:100] Out: 20 [Drains:20] Intake/Output this shift: Total I/O In: 240 [P.O.:240] Out: -   Lungs: nonlabored breathing  Cardiovascular: RRR  Abd: soft, NT, ND, drain with thin output  Extremities: no edema  Neuro: AOx4  Lab Results: CBC  Recent Labs    05/14/18 0601 05/15/18 0552  WBC 18.2* 15.7*  HGB 11.3* 11.9*  HCT 36.6 36.8  PLT 344 339   BMET Recent Labs    05/13/18 0248 05/14/18 0601  NA 137 141  K 2.9* 3.9  CL 108 110  CO2 20* 23  GLUCOSE 117* 106*  BUN 46* 26*  CREATININE 1.44* 1.00  CALCIUM 7.5* 8.4*   PT/INR No results for input(s): LABPROT, INR in the last 72 hours. ABG No results for input(s): PHART, HCO3 in the last 72 hours.  Invalid input(s): PCO2, PO2  Studies/Results:  Anti-infectives: Anti-infectives (From admission, onward)   Start     Dose/Rate Route  Frequency Ordered Stop   05/14/18 2200  cefTRIAXone (ROCEPHIN) 2 g in sodium chloride 0.9 % 100 mL IVPB     2 g 200 mL/hr over 30 Minutes Intravenous Every 24 hours 05/14/18 1601     05/12/18 1300  piperacillin-tazobactam (ZOSYN) IVPB 3.375 g  Status:  Discontinued     3.375 g 12.5 mL/hr over 240 Minutes Intravenous Every 8 hours 05/12/18 0805 05/14/18 1601   05/12/18 0615  piperacillin-tazobactam (ZOSYN) IVPB 3.375 g  Status:  Discontinued     3.375 g 100 mL/hr over 30 Minutes Intravenous Every 8 hours 05/12/18 0600 05/12/18 0611   05/12/18 0600  piperacillin-tazobactam (ZOSYN) IVPB 2.25 g  Status:  Discontinued     2.25 g 100 mL/hr over 30 Minutes Intravenous Every 8 hours 05/12/18 0443 05/12/18 0804   05/11/18 1915  piperacillin-tazobactam (ZOSYN) IVPB 3.375 g     3.375 g 100 mL/hr over 30 Minutes Intravenous  Once 05/11/18 1907 05/11/18 2013      Medications: Scheduled Meds: . aspirin EC  81 mg Oral Daily  . diltiazem  240 mg Oral Daily  . enoxaparin (LOVENOX) injection  40 mg Subcutaneous Q24H  . potassium chloride  40 mEq Oral BID  . sodium chloride flush  3 mL Intravenous Once  . sodium chloride flush  5 mL Intracatheter Q8H   Continuous Infusions: . sodium chloride 10 mL/hr at 05/13/18 0539  . cefTRIAXone (ROCEPHIN)  IV Stopped (05/14/18 2238)   PRN Meds:.sodium chloride, acetaminophen **OR** acetaminophen, fentaNYL (SUBLIMAZE) injection, hydrALAZINE, ondansetron **OR**  ondansetron (ZOFRAN) IV, senna-docusate, traZODone  Rodman Pickle, MD [facility]

## 2018-05-15 NOTE — Progress Notes (Signed)
PROGRESS NOTE    Monique Castillo  VWP:[id number] DOB: 10-23-1945 DOA: 05/11/2018 PCP: Patient, [name]    Brief Narrative:  Patient is 72 year old female who has never gone to a doctor .  No known chronic medical problems.  No recent medical follow-up.  She had been having some abdominal pain for about a week.  Pain was worsening so came to ER.  She was found to have ruptured appendicular abscess and rapid A. fib.  Also found to have renal insufficiency.  Assessment & Plan:   Active Problems:   Appendicitis   Hyponatremia   Hypokalemia   Dehydration   Sepsis (HCC)   Intraabdominal fluid collection   AKI (acute kidney injury) (HCC)   Atrial fibrillation with RVR (HCC)  Ruptured appendicular abscess: Clinically stabilizing.  Status post percutaneous drain.  On regular diet.  Pansensitive E. coli.  Was treated with Rocephin.  Will change to oral Levaquin starting tomorrow for total of 10 days.  Followed by surgery and IR. Ambulate and advance activities.   Paroxysmal A. fib with RVR: Unknown whether new or old.  Treated with IV Cardizem and rate controlled.  Currently rate controlled in sinus rhythm.  Will change to long-acting Cardizem.  TSH normal.  Echocardiogram with normal ejection fraction.    Unadjusted stroke rate    CHADVASC 2 score  0 points: 0.2% per year  1 point: 0.6% per year  2 points: 2.2% per year  3 points: 3.2% per year  4 points: 4.8% per year  5 points: 7.2% per year  6 points: 9.7% per year  7 points: 11.2% per year  8 points: 10.8% per year  9 points: 12.2% per year    HAS-BLED score interpretation     0 points: 1.13 bleeds per 100 patient-years  1 point: 1.02 bleeds per 100 patient-years  2 points: 1.88 bleeds per 100 patient-years  3 points: 3.74 bleeds per 100 patient-years  4 points: 8.70 bleeds per 100 patient-years  5 to 9 points: Insufficient data    Patient will benefit with therapeutic anticoagulation.  We discussed in detail  about choices including different novel anticoagulant therapies.  After careful discussion, patient really does not want to start any blood thinners.  Patient is also not very optimistic whether she will continue to take even aspirin.  I advised her at least to consider aspirin as suboptimal therapeutic options and she agreed.  Will start patient on aspirin 81 mg daily.  Acute renal failure:  Normalized.  Hypokalemia: Replaced And normalized.  Hypertension: Suspect underlying untreated hypertension.  Her blood pressures are creeping up now.  Will increase dose of Cardizem to 240 mg/day and monitor.  Advised salt restriction.  DVT prophylaxis: SCDs.,  Lovenox. Code Status: Full code. Family Communication: Husband at the bedside. Disposition Plan: Home when stable.  Anticipate she will go home tomorrow with a drain and IR follow-up.  Consultants:   General surgery  Interventional radiology  Procedures:   CT-guided abdominal drain  Antimicrobials:   IV Zosyn3/18>> 3/21  IV Rocephin 3/21>>   Subjective: patient was seen and examined.  Occasional abdomen pain on palpation and movement.  Denies any other complaints.  Denies any chest pain or palpitations.  20 mL output from the drain over last 24 hours.  Objective: Vitals:   05/14/18 0431 05/14/18 1356 05/14/18 2003 05/15/18 0510  BP: (!) 160/89 (!) 163/82 (!) 179/98 (!) 167/97  Pulse: 75 82 73 83  Resp: 20 16 18 19   Temp:  98.4 F (36.9 C) 98.2 F (36.8 C) 98.3 F (36.8 C) 97.9 F (36.6 C)  TempSrc:  Oral Oral Oral  SpO2: 95% 98% 100% 97%  Weight:      Height:        Intake/Output Summary (Last 24 hours) at 05/15/2018 1038 Last data filed at 05/15/2018 0900 Gross per 24 hour  Intake 340 ml  Output 20 ml  Net 320 ml   Filed Weights   05/11/18 1640  Weight: 74.8 kg    Examination:  General exam: Appears calm and comfortable  Respiratory system: Clear to auscultation. Respiratory effort normal.   Cardiovascular system: S1 & S2 heard, RRR. No JVD, murmurs, rubs, gallops or clicks. No pedal edema. Gastrointestinal system: Abdomen is nondistended, soft and she has diffuse moderate tenderness along the right lower quadrant with percutaneous drain in place.  No organomegaly or masses felt. Normal bowel sounds heard. Central nervous system: Alert and oriented. No focal neurological deficits. Extremities: Symmetric 5 x 5 power. Skin: No rashes, lesions or ulcers Psychiatry: Judgement and insight appear normal. Mood & affect appropriate.     Data Reviewed: I have personally reviewed following labs and imaging studies  CBC: Recent Labs  Lab 05/11/18 1646 05/12/18 0622 05/13/18 0248 05/14/18 0601 05/15/18 0552  WBC 27.8* 26.5* 20.5* 18.2* 15.7*  NEUTROABS  --   --  15.8* 13.4*  --   HGB 13.0 11.1* 10.5* 11.3* 11.9*  HCT 40.1 33.5* 32.9* 36.6 36.8  MCV 93.7 92.5 95.4 96.3 94.4  PLT 290 266 261 344 339   Basic Metabolic Panel: Recent Labs  Lab 05/11/18 1646 05/11/18 1723 05/12/18 0622 05/13/18 0248 05/14/18 0601  NA 132*  --  135 137 141  K 3.3*  --  3.1* 2.9* 3.9  CL 93*  --  102 108 110  CO2 22  --  20* 20* 23  GLUCOSE 115*  --  104* 117* 106*  BUN 73*  --  64* 46* 26*  CREATININE 2.53* 2.70* 1.95* 1.44* 1.00  CALCIUM 8.6*  --  7.5* 7.5* 8.4*  MG 2.7*  --  2.4  --   --   PHOS  --   --  3.6  --   --    GFR: Estimated Creatinine Clearance: 53.4 mL/min (by C-G formula based on SCr of 1 mg/dL). Liver Function Tests: Recent Labs  Lab 05/11/18 1646 05/12/18 0622  AST 26 13*  ALT 76* 63*  ALKPHOS 238* 197*  BILITOT 0.9 0.5  PROT 7.1 6.0*  ALBUMIN 2.8* 2.2*   Recent Labs  Lab 05/11/18 1646  LIPASE 36   No results for input(s): AMMONIA in the last 168 hours. Coagulation Profile: Recent Labs  Lab 05/12/18 0622  INR 1.1   Cardiac Enzymes: No results for input(s): CKTOTAL, CKMB, CKMBINDEX, TROPONINI in the last 168 hours. BNP (last 3 results) No results  for input(s): PROBNP in the last 8760 hours. HbA1C: Recent Labs    05/13/18 0248  HGBA1C 5.8*   CBG: No results for input(s): GLUCAP in the last 168 hours. Lipid Profile: No results for input(s): CHOL, HDL, LDLCALC, TRIG, CHOLHDL, LDLDIRECT in the last 72 hours. Thyroid Function Tests: No results for input(s): TSH, T4TOTAL, FREET4, T3FREE, THYROIDAB in the last 72 hours. Anemia Panel: No results for input(s): VITAMINB12, FOLATE, FERRITIN, TIBC, IRON, RETICCTPCT in the last 72 hours. Sepsis Labs: Recent Labs  Lab 05/11/18 2017 05/12/18 0622  PROCALCITON  --  7.35  LATICACIDVEN 0.9 0.7  Recent Results (from the past 240 hour(s))  Culture, Urine     Status: None   Collection Time: 05/11/18  6:47 PM  Result Value Ref Range Status   Specimen Description   Final    URINE, CLEAN CATCH Performed at [facility], [address], [place], [place] [address]    Special Requests   Final    NONE Performed at [facility], [address], [place], [place] [address]    Culture   Final    Multiple bacterial morphotypes present, none predominant. Suggest appropriate recollection if clinically indicated.   Report Status 05/13/2018 FINAL  Final  Surgical pcr screen     Status: None   Collection Time: 05/12/18  4:11 AM  Result Value Ref Range Status   MRSA, PCR NEGATIVE NEGATIVE Final   Staphylococcus aureus NEGATIVE NEGATIVE Final    Comment: (NOTE) The Xpert SA Assay (FDA approved for NASAL specimens in patients 63 years of age and older), is one component of a comprehensive surveillance program. It is not intended to diagnose infection nor to guide or monitor treatment. Performed at [facility], [address] [place], [place] [address]   Culture, blood (x 2)     Status: None (Preliminary result)   Collection Time: 05/12/18  6:22 AM  Result Value Ref Range Status   Specimen Description   Final    BLOOD LEFT HAND  Performed at [facility], [address] [place], [place] [address]    Special Requests   Final    BOTTLES DRAWN AEROBIC AND ANAEROBIC Blood Culture adequate volume Performed at [facility], [address], [place], [place] [address]    Culture   Final    NO GROWTH 3 DAYS Performed at [facility], [address], [place], [place] [address]    Report Status PENDING  Incomplete  Culture, blood (x 2)     Status: None (Preliminary result)   Collection Time: 05/12/18  6:27 AM  Result Value Ref Range Status   Specimen Description   Final    BLOOD RIGHT ANTECUBITAL Performed at [facility], [address], [place], [place] [address]    Special Requests   Final    BOTTLES DRAWN AEROBIC AND ANAEROBIC Blood Culture results may not be optimal due to an excessive volume of blood received in culture bottles Performed at [facility], [address], [place], [place] [address]    Culture   Final    NO GROWTH 3 DAYS Performed at [facility], [address] [place], [place] [address]    Report Status PENDING  Incomplete  Aerobic/Anaerobic Culture (surgical/deep wound)     Status: None (Preliminary result)   Collection Time: 05/12/18  5:34 PM  Result Value Ref Range Status   Specimen Description   Final    ABDOMEN Performed at [facility], [address], [place], [place] [address]    Special Requests   Final    NONE Performed at [facility], [address] [place], [place] [address]    Gram Stain   Final    ABUNDANT WBC PRESENT,BOTH PMN AND MONONUCLEAR GRAM POSITIVE COCCI IN PAIRS IN CLUSTERS ABUNDANT GRAM NEGATIVE RODS    Culture   Final    MODERATE ESCHERICHIA COLI CULTURE REINCUBATED FOR BETTER GROWTH Performed at [facility], [address], [place], [place] [address]  Report Status PENDING  Incomplete   Organism ID, Bacteria  ESCHERICHIA COLI  Final      Susceptibility   Escherichia coli - MIC*    AMPICILLIN 8 SENSITIVE Sensitive     CEFAZOLIN <=4 SENSITIVE Sensitive     CEFEPIME <=1 SENSITIVE Sensitive     CEFTAZIDIME <=1 SENSITIVE Sensitive     CEFTRIAXONE <=1 SENSITIVE Sensitive     CIPROFLOXACIN <=0.25 SENSITIVE Sensitive     GENTAMICIN <=1 SENSITIVE Sensitive     IMIPENEM <=0.25 SENSITIVE Sensitive     TRIMETH/SULFA <=20 SENSITIVE Sensitive     AMPICILLIN/SULBACTAM <=2 SENSITIVE Sensitive     PIP/TAZO <=4 SENSITIVE Sensitive     Extended ESBL NEGATIVE Sensitive     * MODERATE ESCHERICHIA COLI  MRSA PCR Screening     Status: None   Collection Time: 05/12/18  6:04 PM  Result Value Ref Range Status   MRSA by PCR NEGATIVE NEGATIVE Final    Comment:        The GeneXpert MRSA Assay (FDA approved for NASAL specimens only), is one component of a comprehensive MRSA colonization surveillance program. It is not intended to diagnose MRSA infection nor to guide or monitor treatment for MRSA infections. Performed at [facility], [address] [place], [place] [address]          Radiology Studies: No results found.      Scheduled Meds: . aspirin EC  81 mg Oral Daily  . diltiazem  240 mg Oral Daily  . enoxaparin (LOVENOX) injection  40 mg Subcutaneous Q24H  . potassium chloride  40 mEq Oral BID  . sodium chloride flush  3 mL Intravenous Once  . sodium chloride flush  5 mL Intracatheter Q8H   Continuous Infusions: . sodium chloride 10 mL/hr at 05/13/18 0539  . cefTRIAXone (ROCEPHIN)  IV Stopped (05/14/18 2238)     LOS: 4 days    Time spent: 25 minutes    Dorcas CarrowKuber Zairah Arista, MD [facility] Pager [phone]  If 7PM-7AM, please contact night-coverage [email or web address] Password [email or web address] 05/15/2018, 10:38 AM

## 2018-05-16 ENCOUNTER — Inpatient Hospital Stay (HOSPITAL_COMMUNITY): Payer: Medicare Other

## 2018-05-16 LAB — AEROBIC/ANAEROBIC CULTURE W GRAM STAIN (SURGICAL/DEEP WOUND)

## 2018-05-16 LAB — BASIC METABOLIC PANEL
Anion gap: 9 (ref 5–15)
BUN: 12 mg/dL (ref 8–23)
CALCIUM: 8.1 mg/dL — AB (ref 8.9–10.3)
CO2: 23 mmol/L (ref 22–32)
Chloride: 105 mmol/L (ref 98–111)
Creatinine, Ser: 0.76 mg/dL (ref 0.44–1.00)
GFR calc Af Amer: >60 mL/min (ref >60)
Glucose, Bld: 101 mg/dL — ABNORMAL HIGH (ref 70–99)
Potassium: 4.2 mmol/L (ref 3.5–5.1)
Sodium: 137 mmol/L (ref 135–145)

## 2018-05-16 LAB — GLUCOSE, CAPILLARY: Glucose-Capillary: 85 mg/dL (ref 70–99)

## 2018-05-16 LAB — CBC WITH DIFFERENTIAL/PLATELET
Abs Immature Granulocytes: 0.45 10*3/uL — ABNORMAL HIGH (ref 0.00–0.07)
Basophils Absolute: 0 10*3/uL (ref 0.0–0.1)
Basophils Relative: 0 %
Eosinophils Absolute: 0.2 10*3/uL (ref 0.0–0.5)
Eosinophils Relative: 1 %
HEMATOCRIT: 36.7 % (ref 36.0–46.0)
Hemoglobin: 11.3 g/dL — ABNORMAL LOW (ref 12.0–15.0)
Immature Granulocytes: 3 %
Lymphocytes Relative: 7 %
Lymphs Abs: 1.2 10*3/uL (ref 0.7–4.0)
MCH: 29.9 pg (ref 26.0–34.0)
MCHC: 30.8 g/dL (ref 30.0–36.0)
MCV: 97.1 fL (ref 80.0–100.0)
Monocytes Absolute: 1 10*3/uL (ref 0.1–1.0)
Monocytes Relative: 6 %
Neutro Abs: 13.6 10*3/uL — ABNORMAL HIGH (ref 1.7–7.7)
Neutrophils Relative %: 83 %
Platelets: 320 10*3/uL (ref 150–400)
RBC: 3.78 MIL/uL — ABNORMAL LOW (ref 3.87–5.11)
RDW: 14 % (ref 11.5–15.5)
WBC: 16.4 10*3/uL — ABNORMAL HIGH (ref 4.0–10.5)
nRBC: 0 % (ref 0.0–0.2)

## 2018-05-16 MED ORDER — SODIUM CHLORIDE 0.9% FLUSH: 5.0000 mL | Freq: Every day | INTRAVENOUS | 0 refills | Status: AC

## 2018-05-16 MED ORDER — SODIUM CHLORIDE (PF) 0.9 % IJ SOLN
INTRAMUSCULAR | Status: AC
  Filled 2018-05-16: qty 50

## 2018-05-16 MED ORDER — SACCHAROMYCES BOULARDII 250 MG PO CAPS
250.0000 mg | ORAL_CAPSULE | Freq: Two times a day (BID) | ORAL | Status: DC
  Administered 2018-05-16 – 2018-05-17 (×3): 250 mg via ORAL
  Filled 2018-05-16 (×4): qty 1

## 2018-05-16 MED ORDER — IOHEXOL 300 MG/ML  SOLN
100.0000 mL | Freq: Once | INTRAMUSCULAR | Status: AC | PRN
  Administered 2018-05-16: 100 mL via INTRAVENOUS

## 2018-05-16 MED ORDER — SACCHAROMYCES BOULARDII 250 MG PO CAPS: ORAL_CAPSULE | ORAL | Status: DC

## 2018-05-16 MED ORDER — ACETAMINOPHEN 325 MG PO TABS: ORAL_TABLET | ORAL | Status: DC

## 2018-05-16 MED ORDER — LEVOFLOXACIN 750 MG PO TABS: 750.0000 mg | ORAL_TABLET | ORAL | 0 refills | Status: DC

## 2018-05-16 NOTE — Progress Notes (Addendum)
CC: Abdominal pain  Subjective: Patient looks great and wants to know she can go home. IR looked at her earlier this morning, there notices that she has less than TCD's consider repeat imaging for possible removal tomorrow.  She is soft nontender and tolerating a regular diet. Objective: Vital signs in last 24 hours: Temp:  [97.7 F (36.5 C)-98.7 F (37.1 C)] 97.7 F (36.5 C) (03/23 0504) Pulse Rate:  [75-88] 78 (03/23 0504) Resp:  [18-19] 18 (03/23 0504) BP: (152-161)/(86-89) 161/86 (03/23 0504) SpO2:  [97 %-98 %] 98 % (03/23 0504) Last BM Date: 05/15/18 1070 PO Urine x 2 Drain x 10 Afebrile, VSS diastolic BP is up some Potassium up to 4.2, WBC 20.5>>18.2>>15.7>>16.4 Intake/Output from previous day: 03/22 0701 - 03/23 0700 In: 1075 [P.O.:1070] Out: 10 [Drains:10] Intake/Output this shift: No intake/output data recorded.  General appearance: alert, cooperative and no distress GI: Soft nontender drain is clear serous fluid.  No pain this a.m.  Lab Results:  Recent Labs    05/15/18 0552 05/16/18 0529  WBC 15.7* 16.4*  HGB 11.9* 11.3*  HCT 36.8 36.7  PLT 339 320    BMET Recent Labs    05/14/18 0601 05/16/18 0529  NA 141 137  K 3.9 4.2  CL 110 105  CO2 23 23  GLUCOSE 106* 101*  BUN 26* 12  CREATININE 1.00 0.76  CALCIUM 8.4* 8.1*   PT/INR No results for input(s): LABPROT, INR in the last 72 hours.  Recent Labs  Lab 05/11/18 1646 05/12/18 0622  AST 26 13*  ALT 76* 63*  ALKPHOS 238* 197*  BILITOT 0.9 0.5  PROT 7.1 6.0*  ALBUMIN 2.8* 2.2*     Lipase     Component Value Date/Time   LIPASE 36 05/11/2018 1646     Medications: . aspirin EC  81 mg Oral Daily  . diltiazem  240 mg Oral Daily  . enoxaparin (LOVENOX) injection  40 mg Subcutaneous Q24H  . levofloxacin  750 mg Oral Q24H  . sodium chloride flush  3 mL Intravenous Once  . sodium chloride flush  5 mL Intracatheter Q8H   . sodium chloride 10 mL/hr at 05/13/18 0539    Anti-infectives (From admission, onward)   Start     Dose/Rate Route Frequency Ordered Stop   05/15/18 2200  levofloxacin (LEVAQUIN) tablet 750 mg     750 mg Oral Every 24 hours 05/15/18 1046 05/22/18 2159   05/14/18 2200  cefTRIAXone (ROCEPHIN) 2 g in sodium chloride 0.9 % 100 mL IVPB     2 g 200 mL/hr over 30 Minutes Intravenous Every 24 hours 05/14/18 1601 05/15/18 2227   05/12/18 1300  piperacillin-tazobactam (ZOSYN) IVPB 3.375 g  Status:  Discontinued     3.375 g 12.5 mL/hr over 240 Minutes Intravenous Every 8 hours 05/12/18 0805 05/14/18 1601   05/12/18 0615  piperacillin-tazobactam (ZOSYN) IVPB 3.375 g  Status:  Discontinued     3.375 g 100 mL/hr over 30 Minutes Intravenous Every 8 hours 05/12/18 0600 05/12/18 0611   05/12/18 0600  piperacillin-tazobactam (ZOSYN) IVPB 2.25 g  Status:  Discontinued     2.25 g 100 mL/hr over 30 Minutes Intravenous Every 8 hours 05/12/18 0443 05/12/18 0804   05/11/18 1915  piperacillin-tazobactam (ZOSYN) IVPB 3.375 g     3.375 g 100 mL/hr over 30 Minutes Intravenous  Once 05/11/18 1907 05/11/18 2013      Assessment/Plan  Atrial fibrillation with RVR Hyponatremia/hypokalemia Acute kidney injury Dehydration Probable UTI  Acute perforated appendicitis with 2 large intra-abdominal abscesses -perc drain in one collection 3/19 -WBC down to 20K -cont regular diet at this point as she is already tolerating this -will need repeat CT scan in about a week to re-evaluate collection drain is in and multiple other fluid collections -cont IV abx therapy for now -mobilize and pulm toilet  FEN: IV fluids/regular diet ID:  Rocephin 3/21- 3/22; Zosyn 3/18- 3/21; Levaquin tablets PO 3/22 >> day 2 DVT: SCDs/ Lovenox Follow-up: Dr. Gerrit Friends  Plan: Home when okay with IR.  We would recommend 10 more days of antibiotics p.o.   Follow up is in the [facility], with Dr. Gerrit Friends.  She can continue to use plain Tylenol and ibuprofen for pain relief.  I would add a  probiotic also.  LOS: 5 days    Dayron Odland 05/16/2018 [id number]

## 2018-05-16 NOTE — Progress Notes (Signed)
PROGRESS NOTE    Monique Castillo  ZOX:[id number] DOB: Mar 10, 1945 DOA: 05/11/2018 PCP: Patient, [name]    Brief Narrative:  Patient is 73 year old female who has never gone to a doctor .  No known chronic medical problems.  No recent medical follow-up.  She had been having some abdominal pain for about a week.  Pain was worsening so came to ER.  She was found to have ruptured appendicular abscess and rapid A. fib.  Also found to have renal insufficiency.  Assessment & Plan:   Active Problems:   Appendicitis   Hyponatremia   Hypokalemia   Dehydration   Sepsis (HCC)   Intraabdominal fluid collection   AKI (acute kidney injury) (HCC)   Atrial fibrillation with RVR (HCC)  Ruptured appendicular abscess: Clinically stabilizing. Status post percutaneous drain.  On regular diet.  Pansensitive E. coli.  Was treated with Rocephin  Will change to oral Levaquin for 7 more days.  Followed by surgery and IR. Ambulate and advance activities.  IR planning for repeat imaging with injection tomorrow before removing catheter.  Paroxysmal A. fib with RVR: Unknown whether new or old.  Treated with IV Cardizem and rate controlled.  Currently rate controlled in sinus rhythm.  TSH normal.  Echocardiogram with normal ejection fraction.    Unadjusted stroke rate    CHADVASC 2 score  0 points: 0.2% per year  1 point: 0.6% per year  2 points: 2.2% per year  3 points: 3.2% per year  4 points: 4.8% per year  5 points: 7.2% per year  6 points: 9.7% per year  7 points: 11.2% per year  8 points: 10.8% per year  9 points: 12.2% per year    HAS-BLED score interpretation     0 points: 1.13 bleeds per 100 patient-years  1 point: 1.02 bleeds per 100 patient-years  2 points: 1.88 bleeds per 100 patient-years  3 points: 3.74 bleeds per 100 patient-years  4 points: 8.70 bleeds per 100 patient-years  5 to 9 points: Insufficient data    Patient will benefit with therapeutic anticoagulation.  We  discussed in detail about choices including different novel anticoagulant therapies.  After careful discussion, patient really does not want to start any blood thinners.  Patient is also not very optimistic whether she will continue to take even aspirin.  I advised her at least to consider aspirin as suboptimal therapeutic options and she agreed.  Will start patient on aspirin 81 mg daily.  Acute renal failure:  Normalized.  Hypokalemia: Replaced And normalized.  Hypertension: Suspect underlying untreated hypertension.  Increase dose of diltiazem to 240 mg daily.  DVT prophylaxis: SCDs.,  Lovenox. Code Status: Full code. Family Communication: No family at bedside. Disposition Plan: Home when stable.  Anticipate tomorrow after IR removal of the drain.  Consultants:   General surgery  Interventional radiology  Procedures:   CT-guided abdominal drain  Antimicrobials:   IV Zosyn3/18>> 3/21  IV Rocephin 3/21>> 05/15/2018  Levaquin p.o. 05/16/2018>>>   Subjective: patient was seen and examined.  Denies any complaints.  Very occasional right-sided abdominal pain.  No drain on the tube.  Eager to go home. Objective: Vitals:   05/15/18 0510 05/15/18 1400 05/15/18 1900 05/16/18 0504  BP: (!) 167/97 (!) 152/89 (!) 160/86 (!) 161/86  Pulse: 83 88 75 78  Resp: 19 18 19 18   Temp: 97.9 F (36.6 C) 98.7 F (37.1 C) 98.2 F (36.8 C) 97.7 F (36.5 C)  TempSrc: Oral Oral Oral Oral  SpO2: 97% 97% 97% 98%  Weight:      Height:        Intake/Output Summary (Last 24 hours) at 05/16/2018 1105 Last data filed at 05/16/2018 0700 Gross per 24 hour  Intake 835 ml  Output 10 ml  Net 825 ml   Filed Weights   05/11/18 1640  Weight: 74.8 kg    Examination:  General exam: Appears calm and comfortable  Respiratory system: Clear to auscultation. Respiratory effort normal.  Cardiovascular system: S1 & S2 heard, RRR. No JVD, murmurs, rubs, gallops or clicks. No pedal edema.  Gastrointestinal system: Abdomen is nondistended, soft and she has diffuse moderate tenderness along the right lower quadrant with percutaneous drain in place.  No organomegaly or masses felt. Normal bowel sounds heard. Central nervous system: Alert and oriented. No focal neurological deficits. Extremities: Symmetric 5 x 5 power. Skin: No rashes, lesions or ulcers Psychiatry: Judgement and insight appear normal. Mood & affect appropriate.     Data Reviewed: I have personally reviewed following labs and imaging studies  CBC: Recent Labs  Lab 05/12/18 0622 05/13/18 0248 05/14/18 0601 05/15/18 0552 05/16/18 0529  WBC 26.5* 20.5* 18.2* 15.7* 16.4*  NEUTROABS  --  15.8* 13.4*  --  13.6*  HGB 11.1* 10.5* 11.3* 11.9* 11.3*  HCT 33.5* 32.9* 36.6 36.8 36.7  MCV 92.5 95.4 96.3 94.4 97.1  PLT 266 261 344 339 320   Basic Metabolic Panel: Recent Labs  Lab 05/11/18 1646 05/11/18 1723 05/12/18 0622 05/13/18 0248 05/14/18 0601 05/16/18 0529  NA 132*  --  135 137 141 137  K 3.3*  --  3.1* 2.9* 3.9 4.2  CL 93*  --  102 108 110 105  CO2 22  --  20* 20* 23 23  GLUCOSE 115*  --  104* 117* 106* 101*  BUN 73*  --  64* 46* 26* 12  CREATININE 2.53* 2.70* 1.95* 1.44* 1.00 0.76  CALCIUM 8.6*  --  7.5* 7.5* 8.4* 8.1*  MG 2.7*  --  2.4  --   --   --   PHOS  --   --  3.6  --   --   --    GFR: Estimated Creatinine Clearance: 66.7 mL/min (by C-G formula based on SCr of 0.76 mg/dL). Liver Function Tests: Recent Labs  Lab 05/11/18 1646 05/12/18 0622  AST 26 13*  ALT 76* 63*  ALKPHOS 238* 197*  BILITOT 0.9 0.5  PROT 7.1 6.0*  ALBUMIN 2.8* 2.2*   Recent Labs  Lab 05/11/18 1646  LIPASE 36   No results for input(s): AMMONIA in the last 168 hours. Coagulation Profile: Recent Labs  Lab 05/12/18 0622  INR 1.1   Cardiac Enzymes: No results for input(s): CKTOTAL, CKMB, CKMBINDEX, TROPONINI in the last 168 hours. BNP (last 3 results) No results for input(s): PROBNP in the last 8760  hours. HbA1C: No results for input(s): HGBA1C in the last 72 hours. CBG: Recent Labs  Lab 05/16/18 0737  GLUCAP 85   Lipid Profile: No results for input(s): CHOL, HDL, LDLCALC, TRIG, CHOLHDL, LDLDIRECT in the last 72 hours. Thyroid Function Tests: No results for input(s): TSH, T4TOTAL, FREET4, T3FREE, THYROIDAB in the last 72 hours. Anemia Panel: No results for input(s): VITAMINB12, FOLATE, FERRITIN, TIBC, IRON, RETICCTPCT in the last 72 hours. Sepsis Labs: Recent Labs  Lab 05/11/18 2017 05/12/18 0622  PROCALCITON  --  7.35  LATICACIDVEN 0.9 0.7    Recent Results (from the past 240 hour(s))  Culture, Urine  Status: None   Collection Time: 05/11/18  6:47 PM  Result Value Ref Range Status   Specimen Description   Final    URINE, CLEAN CATCH Performed at [facility], [address] [place], [place] [address]    Special Requests   Final    NONE Performed at [facility], [address], [place], [place] [address]    Culture   Final    Multiple bacterial morphotypes present, none predominant. Suggest appropriate recollection if clinically indicated.   Report Status 05/13/2018 FINAL  Final  Surgical pcr screen     Status: None   Collection Time: 05/12/18  4:11 AM  Result Value Ref Range Status   MRSA, PCR NEGATIVE NEGATIVE Final   Staphylococcus aureus NEGATIVE NEGATIVE Final    Comment: (NOTE) The Xpert SA Assay (FDA approved for NASAL specimens in patients 33 years of age and older), is one component of a comprehensive surveillance program. It is not intended to diagnose infection nor to guide or monitor treatment. Performed at [facility], [address] [place] [address] [place], [place] [address]   Culture, blood (x 2)     Status: None (Preliminary result)   Collection Time: 05/12/18  6:22 AM  Result Value Ref Range Status   Specimen Description   Final    BLOOD LEFT HAND Performed at [facility],  [address] [place] [address], [place], [place] [address]    Special Requests   Final    BOTTLES DRAWN AEROBIC AND ANAEROBIC Blood Culture adequate volume Performed at [facility], [address], [place], [place] [address]    Culture   Final    NO GROWTH 4 DAYS Performed at [facility], [address], [place], [place] [address]    Report Status PENDING  Incomplete  Culture, blood (x 2)     Status: None (Preliminary result)   Collection Time: 05/12/18  6:27 AM  Result Value Ref Range Status   Specimen Description   Final    BLOOD RIGHT ANTECUBITAL Performed at [facility], [address], [place], [place] [address]    Special Requests   Final    BOTTLES DRAWN AEROBIC AND ANAEROBIC Blood Culture results may not be optimal due to an excessive volume of blood received in culture bottles Performed at [facility] [place] [facility], [address] [place], [place] [address]    Culture   Final    NO GROWTH 4 DAYS Performed at [facility], [address], [place], [place] [address]    Report Status PENDING  Incomplete  Aerobic/Anaerobic Culture (surgical/deep wound)     Status: None (Preliminary result)   Collection Time: 05/12/18  5:34 PM  Result Value Ref Range Status   Specimen Description   Final    ABDOMEN Performed at [facility], [address], [place], [place] [address]    Special Requests   Final    NONE Performed at [facility], [address], [place], [place] [address]    Gram Stain   Final    ABUNDANT WBC PRESENT,BOTH PMN AND MONONUCLEAR GRAM POSITIVE COCCI IN PAIRS IN CLUSTERS ABUNDANT GRAM NEGATIVE RODS Performed at [facility], [address], [place], [place] [address]    Culture   Final    MODERATE ESCHERICHIA COLI ABUNDANT BACTEROIDES FRAGILIS BETA LACTAMASE POSITIVE STREPTOCOCCUS GROUP C    Report Status PENDING  Incomplete   Organism ID,  Bacteria ESCHERICHIA COLI   Final      Susceptibility   Escherichia coli - MIC*    AMPICILLIN 8 SENSITIVE Sensitive     CEFAZOLIN <=4 SENSITIVE Sensitive     CEFEPIME <=1 SENSITIVE Sensitive     CEFTAZIDIME <=1 SENSITIVE Sensitive     CEFTRIAXONE <=1 SENSITIVE Sensitive     CIPROFLOXACIN <=0.25 SENSITIVE Sensitive     GENTAMICIN <=1 SENSITIVE Sensitive     IMIPENEM <=0.25 SENSITIVE Sensitive     TRIMETH/SULFA <=20 SENSITIVE Sensitive     AMPICILLIN/SULBACTAM <=2 SENSITIVE Sensitive     PIP/TAZO <=4 SENSITIVE Sensitive     Extended ESBL NEGATIVE Sensitive     * MODERATE ESCHERICHIA COLI  MRSA PCR Screening     Status: None   Collection Time: 05/12/18  6:04 PM  Result Value Ref Range Status   MRSA by PCR NEGATIVE NEGATIVE Final    Comment:        The GeneXpert MRSA Assay (FDA approved for NASAL specimens only), is one component of a comprehensive MRSA colonization surveillance program. It is not intended to diagnose MRSA infection nor to guide or monitor treatment for MRSA infections. Performed at [facility], [address], [place], [place] [address]          Radiology Studies: No results found.      Scheduled Meds: . aspirin EC  81 mg Oral Daily  . diltiazem  240 mg Oral Daily  . enoxaparin (LOVENOX) injection  40 mg Subcutaneous Q24H  . levofloxacin  750 mg Oral Q24H  . sodium chloride flush  3 mL Intravenous Once  . sodium chloride flush  5 mL Intracatheter Q8H   Continuous Infusions: . sodium chloride 10 mL/hr at 05/13/18 0539     LOS: 5 days    Time spent: 25 minutes    Dorcas Carrow, MD [facility] Pager [phone]  If 7PM-7AM, please contact night-coverage [email or web address] Password [email or web address] 05/16/2018, 11:05 AM

## 2018-05-16 NOTE — Discharge Instructions (Signed)
Appendicitis, Adult  The appendix is a tube in the body that is shaped like a finger. It is attached to the large intestine. Appendicitis means that this tube is swollen (inflamed). If this is not treated, the tube can tear (rupture). This can lead to a life-threatening infection. This condition can also cause pus to build up in the appendix (abscess). What are the causes? This condition may be caused by something that blocks the appendix. These include:  A ball of poop (stool).  Lymph glands that are bigger than normal. Sometimes the cause is not known. What increases the risk? You are more likely to develop this condition if you are between 10 and 30 years of age. What are the signs or symptoms? Symptoms of this condition include:  Pain around the belly button (navel). ? The pain moves toward the lower right belly (abdomen). ? The pain can get worse with time. ? The pain can get worse if you cough. ? The pain can get worse if you move suddenly.  Tenderness in the lower right belly.  Feeling sick to your stomach (nauseous).  Throwing up (vomiting).  Not feeling hungry (loss of appetite).  A fever.  Having trouble pooping (constipation).  Watery poop (diarrhea).  Not feeling well. How is this treated? Most often, this condition is treated by taking out the appendix (appendectomy). There are two ways to do this:  Open surgery. For this method, the appendix is taken out through a large cut (incision). The cut is made in the lower right belly. This surgery may be used if: ? You have scars from another surgery. ? You have a bleeding condition. ? You are pregnant and will be having your baby soon. ? You have a condition that makes it hard to do the other type of surgery.  Laparoscopic surgery. For this method, the appendix is taken out through small cuts. Often, this surgery: ? Causes less pain. ? Causes fewer problems. ? Is easier to heal from. If your appendix tears and  pus forms:  A drain may be put into the sore. The drain will be used to get rid of the pus.  You may get an antibiotic medicine through an IV line.  Your appendix may or may not need to be taken out. Follow these instructions at home: If you had surgery, follow instructions from your doctor on how to care for yourself at home and how to take care of your cut from surgery. Medicines  Take over-the-counter and prescription medicines only as told by your doctor.  If you were prescribed an antibiotic medicine, take it as told by your doctor. Do not stop taking the antibiotic even if you start to feel better. Eating and drinking Follow instructions from your doctor about what you cannot eat or drink. You may go back to your diet slowly if:  You no longer feel sick to your stomach.  You have stopped throwing up. General instructions  Do not use any products that contain nicotine or tobacco, such as cigarettes, e-cigarettes, and chewing tobacco. If you need help quitting, ask your doctor.  Do not drive or use heavy machinery while taking prescription pain medicine.  Ask your doctor if the medicine you are taking can cause trouble pooping. You may need to take steps to prevent or treat trouble pooping: ? Drink enough fluid to keep your pee (urine) pale yellow. ? Take over-the-counter or prescription medicines. ? Eat foods that are high in fiber. These include beans,   whole grains, and fresh fruits and vegetables. ? Limit foods that are high in fat and sugar. These include fried or Scheerer foods.  Keep all follow-up visits as told by your doctor. This is important. Contact a doctor if:  There is pus, blood, or a lot of fluid coming from your cut or cuts from surgery.  You are sick to your stomach or you throw up. Get help right away if:  You have pain in your belly, and the pain is getting worse.  You have a fever.  You have chills.  You are very tired.  You have muscle  pain.  You are short of breath. Summary  Appendicitis is swelling of the appendix. The appendix is a tube that is shaped like a finger. It is joined to the large intestine.  This condition may be caused by something that blocks the appendix. This can lead to an infection.  This condition is usually treated by taking out the appendix. This information is not intended to replace advice given to you by your health care provider. Make sure you discuss any questions you have with your health care provider. Document Released: 05/04/2011 Document Revised: 07/28/2017 Document Reviewed: 07/28/2017 Elsevier Interactive Patient Education  2019 [employer] Inc[employer]    Surgical [facility] Care Surgical drains are used to remove extra fluid that normally builds up in a surgical wound after surgery. A surgical drain helps to heal a surgical wound. Different kinds of surgical drains include:  Active drains. These drains use suction to pull drainage away from the surgical wound. Drainage flows through a tube to a container outside of the body. It is important to keep the bulb or the drainage container flat (compressed) at all times, except while you empty it. Flattening the bulb or container creates suction. The two most common types of active drains are bulb drains and Hemovac drains.  Passive drains. These drains allow fluid to drain naturally, by gravity. Drainage flows through a tube to a bandage (dressing) or a container outside of the body. Passive drains do not need to be emptied. The most common type of passive drain is the Penrose drain. A drain is placed during surgery. Immediately after surgery, drainage is usually bright red and a little thicker than water. The drainage may gradually turn yellow or pink and become thinner. It is likely that your health care provider will remove the drain when the drainage stops or when the amount decreases to 1-2 Tbsp (15-30 mL) during a 24-hour period. How to care for  your surgical drain It is important to care for your drain to prevent infection. If your drain is placed at your back, or any other hard-to-reach area, ask another person to assist you in performing the following steps:  Keep the skin around the drain dry and covered with a dressing at all times.  Check your drain area every day for signs of infection. Check for: ? More redness, swelling, or pain. ? Pus or a bad smell. ? Cloudy drainage. Follow instructions from your health care provider about how to take care of your drain and how to change your dressing. Change your dressing at least one time every day. Change it more often if needed to keep the dressing dry. Make sure you: 1. Gather your supplies, including: ? Tape. ? Germ-free cleaning solution (sterile saline). ? Split gauze drain sponge: 4 x 4 inches (10 x 10 cm). ? Gauze square: 4 x 4 inches (10 x 10 cm). 2.  Wash your hands with soap and water before you change your dressing. If soap and water are not available, use hand sanitizer. 3. Remove the old dressing. Avoid using scissors to do that. 4. Use sterile saline to clean your skin around the drain. 5. Place the tube through the slit in a drain sponge. Place the drain sponge so that it covers your wound. 6. Place the gauze square or another drain sponge on top of the drain sponge that is on the wound. Make sure the tube is between those layers. 7. Tape the dressing to your skin. 8. If you have an active bulb or Hemovac drain, tape the drainage tube to your skin 1-2 inches (2.5-5 cm) below the place where the tube enters your body. Taping keeps the tube from pulling on any stitches (sutures) that you have. 9. Wash your hands with soap and water. 10. Write down the color of your drainage and how often you change your dressing. How to empty your active bulb or Hemovac drain  1. Make sure that you have a measuring cup that you can empty your drainage into. 2. Wash your hands with soap  and water. If soap and water are not available, use hand sanitizer. 3. Gently move your fingers down the tube while squeezing very lightly. This is called stripping the tube. This clears any drainage, clots, or tissue from the tube. ? Do not pull on the tube. ? You may need to strip the tube several times every day to keep the tube clear. 4. Open the bulb cap or the drain plug. Do not touch the inside of the cap or the bottom of the plug. 5. Empty all of the drainage into the measuring cup. 6. Compress the bulb or the container and replace the cap or the plug. To compress the bulb or the container, squeeze it firmly in the middle while you close the cap or plug the container. 7. Write down the amount of drainage that you have in each 24-hour period. If you have less than 2 Tbsp (30 mL) of drainage during 24 hours, contact your health care provider. 8. Flush the drainage down the toilet. 9. Wash your hands with soap and water. Contact a health care provider if:  You have more redness, swelling, or pain around your drain area.  The amount of drainage that you have is increasing instead of decreasing.  You have pus or a bad smell coming from your drain area.  You have a fever.  You have drainage that is cloudy.  There is a sudden stop or a sudden decrease in the amount of drainage that you have.  Your tube falls out.  Your active draindoes not stay compressedafter you empty it. Summary  Surgical drains are used to remove extra fluid that normally builds up in a surgical wound after surgery.  Different kinds of surgical drains include active drains and passive drains. Active drains use suction to pull drainage away from the surgical wound, and passive drains allow fluid to drain naturally.  It is important to care for your drain to prevent infection. If your drain is placed at your back, or any other hard-to-reach area, ask another person to assist you.  Contact your health care  provider if you have more redness, swelling, or pain around your drain area. This information is not intended to replace advice given to you by your health care provider. Make sure you discuss any questions you have with your health care provider.  Document Released: 02/07/2000 Document Revised: 03/04/2017 Document Reviewed: 08/29/2014 Elsevier Interactive Patient Education  2019 [employer].

## 2018-05-16 NOTE — Care Management Important Message (Signed)
Important Message  Patient Details  Name: Monique Castillo MRN: [id number] Date of Birth: 13-Oct-1945   Medicare Important Message Given:  Yes    Caren Macadam 05/16/2018, 11:41 AMImportant Message  Patient Details  Name: Monique Castillo MRN: [id number] Date of Birth: [birth year]   Medicare Important Message Given:  Yes    Caren Macadam 05/16/2018, 11:41 AM

## 2018-05-16 NOTE — Progress Notes (Signed)
Clarification from earlier chart check note:  This patient does not need to remain inpatient from IR perspective - follow up imaging/injection consideration for possible removal was plan if patient was to remain an inpatient for other medical reasons.   This patient was discussed with Dr. Lowella Dandy who recommends drain to remain if primary team/surgery feels patient is ready for discharge - we will see her in 1 week at our clinic for repeat imaging and possible drain injection. She will need to flush her drain once daily with 5 cc normal saline and record output once daily.   I have placed orders for her to be seen in our clinic, written a prescription for NS flushes, included drain care in her discharge instructions and provided our clinic number under follow up providers should she have questions prior to her appointment time.   Please call IR with further questions or concerns.   Lynnette Caffey, PA-C Pager# [phone]

## 2018-05-16 NOTE — Progress Notes (Signed)
Chart check only:  Patient with perforated appendicitis and periappendiceal abscess s/p drain placement 05/12/18 with Dr. Bonnielee Haff. Culture (+) e.coli. Per I/O 10 cc OP in last 24H, previously 20 cc.  Afebrile, WBC relatively stable at 16.4, hgb 11.3, plt 320, creatinine 0.76.   Continue current management - will check I/O tomorrow and if remains <10 cc will consider repeat imaging/injection for possible removal. IR will continue to follow via chart for now.  Please call with questions or concerns.  Lynnette Caffey, PA-C

## 2018-05-17 LAB — CULTURE, BLOOD (ROUTINE X 2)
Culture: NO GROWTH
Culture: NO GROWTH
Special Requests: ADEQUATE

## 2018-05-17 MED ORDER — DILTIAZEM HCL ER COATED BEADS 240 MG PO CP24: 240.0000 mg | ORAL_CAPSULE | Freq: Every day | ORAL | 2 refills | Status: DC

## 2018-05-17 MED ORDER — ASPIRIN 81 MG PO TBEC: 81.0000 mg | DELAYED_RELEASE_TABLET | Freq: Every day | ORAL | Status: DC

## 2018-05-17 MED ORDER — AMOXICILLIN-POT CLAVULANATE 875-125 MG PO TABS: 1.0000 | ORAL_TABLET | Freq: Two times a day (BID) | ORAL | 0 refills | Status: AC

## 2018-05-17 MED ORDER — AMOXICILLIN-POT CLAVULANATE 875-125 MG PO TABS
1.0000 | ORAL_TABLET | Freq: Two times a day (BID) | ORAL | Status: DC
  Administered 2018-05-17: 1 via ORAL
  Filled 2018-05-17: qty 1

## 2018-05-17 NOTE — Progress Notes (Signed)
Patient ID: Monique Castillo, female   DOB: March 28, 1945, 73 y.o.   MRN: [id number] Patient's latest follow-up CT scan was reviewed by Dr. Lowella Dandy.  The posterior and inferior component of the drained collection has decreased in size as has a contiguous more anterior and cephalad component.  The more medial anterior ill-defined fluid collection has increased in size.  Additional drain placement offered to patient however she refused at this time.  Patient also refused existing drain injection.  She will be discharged home with drain with instructions to flush daily, record output and change dressing every 1-2 days.  She will be scheduled for follow-up CT within 1 to 2 weeks with possible drain injection at that time.

## 2018-05-17 NOTE — TOC Transition Note (Signed)
Transition of Care [facility]) - CM/SW Discharge Note   Patient Details  Name: Quatina Murie MRN: [id number] Date of Birth: 03/23/1945  [facility] CM/SW Contact:  Golda Acre, RN Phone Number: 05/17/2018, 9:46 AM   Clinical Narrative:    Discharged to home with [facility]hRN with [facility]   Final next level of care: Home w Home Health Services Barriers to Discharge: No Barriers Identified   Patient Goals and CMS Choice Patient states their goals for this hospitalization and ongoing recovery are:: just to go home and be safe CMS Medicare.gov Compare Post Acute Care list provided to:: Patient Choice offered to / list presented to : Patient  Discharge Placement  home with hhc                     Discharge Plan and Services   Discharge Planning Services: CM Consult Post Acute Care Choice: Home Health              HH Arranged: RN [facility] Agency: [facility]   Social Determinants of Health (SDOH) Interventions     Readmission Risk Interventions No flowsheet data found.

## 2018-05-17 NOTE — Progress Notes (Addendum)
OI:[id number] pain  Subjective: Pt feels fine, wants to go home and have the drain out.  I have talked with IR and they are reviewing CT from last PM.    Objective: Vital signs in last 24 hours: Temp:  [97.8 F (36.6 C)-98.7 F (37.1 C)] 97.8 F (36.6 C) (03/24 0457) Pulse Rate:  [78-86] 78 (03/24 0457) Resp:  [17] 17 (03/24 0457) BP: (150-172)/(77-91) 172/91 (03/24 0457) SpO2:  [97 %-98 %] 97 % (03/24 0457) Last BM Date: 05/16/18 220 PO Afebrile, VSS BP up some NO labs today Repeat CT 3/23:Interval placement of a periappendiceal drain. The posterior and inferior component has decreased in size, as has a contiguous more anterior and cephalad component. A more medial anterior ill-defined fluid collection (possibly contiguous component) is increased compared to 05/11/2018. 2. Persistent appendicoliths and appendiceal dilatation, with decrease in periappendiceal edema. 3. New small right pleural effusion with persistent right greater than left base volume loss/atelectasis  Intake/Output from previous day: 03/23 0701 - 03/24 0700 In: 225 [P.O.:220] Out: -  Intake/Output this shift: No intake/output data recorded.  General appearance: alert, cooperative and no distress GI: soft, non-tender; bowel sounds normal; no masses,  no organomegaly and soreness around the drain is better.  Drain site is a little red, but OK.  I cleaned and redressed the drain site.    Lab Results:  Recent Labs    05/15/18 0552 05/16/18 0529  WBC 15.7* 16.4*  HGB 11.9* 11.3*  HCT 36.8 36.7  PLT 339 320    BMET Recent Labs    05/16/18 0529  NA 137  K 4.2  CL 105  CO2 23  GLUCOSE 101*  BUN 12  CREATININE 0.76  CALCIUM 8.1*   PT/INR No results for input(s): LABPROT, INR in the last 72 hours.  Recent Labs  Lab 05/11/18 1646 05/12/18 0622  AST 26 13*  ALT 76* 63*  ALKPHOS 238* 197*  BILITOT 0.9 0.5  PROT 7.1 6.0*  ALBUMIN 2.8* 2.2*     Lipase     Component Value  Date/Time   LIPASE 36 05/11/2018 1646     Medications: . amoxicillin-clavulanate  1 tablet Oral Q12H  . aspirin EC  81 mg Oral Daily  . diltiazem  240 mg Oral Daily  . enoxaparin (LOVENOX) injection  40 mg Subcutaneous Q24H  . saccharomyces boulardii  250 mg Oral BID  . sodium chloride flush  5 mL Intracatheter Q8H    Assessment/Plan Atrial fibrillation with RVR Hyponatremia/hypokalemia Acute kidney injury Dehydration Probable UTI  Acute perforated appendicitis with 2 large intra-abdominal abscesses -perc drain in one collection 3/19 -WBC down to 20.5(3/20)>>18.2>>15.7>>16.4(3/23) -cont regular diet at this point as she is already tolerating this -will need repeat CT scan in about a week to re-evaluate collection drain is in and multiple other fluid collections -cont IV abx therapy for now -mobilize and pulm toilet  FEN: IV fluids/regular diet ID:  Rocephin 3/21- 3/22; Zosyn 3/18- 3/21; Levaquin tablets PO 3/22 -3/23; starting Augmentin today 3/24 DVT: SCDs/ Lovenox Follow-up: Dr. Gerrit Friends 06/01/18 @ 11:15 AM  Plan:  Continue abx x 10 days, teach pt how to do drain care and ask Home Health to assist with the care if she goes home with it.  She has follow up listed in the AVS for Dr. Gerrit Friends.  I placed her on probiotic yesterday also.  Continue that at home for now.     CT reviewed by Dr. Lowella Dandy.  He recommended another drain  and possible 2 more drains.  We discussed with the patient and she refused.  Dr. Lowella Dandy then ask if she would allow them to inject the drain to see if the fluid collections were contiguous; she also refused IR drain injection.  She will continue the antibiotics, and current drain and will follow up in the drain clinic.  Jeananne Rama also discussed treatment and discussed recommendations with her . I notified Dr. Jerral Ralph of recommendations and patient refusal.      LOS: 6 days    Monique Castillo 05/17/2018 [phone]

## 2018-05-17 NOTE — Discharge Summary (Signed)
Physician Discharge Summary  Monique Castillo JTT:[id number] DOB: 03-23-1945 DOA: 05/11/2018  PCP: Patient, [name]  Admit date: 05/11/2018 Discharge date: 05/17/2018  Admitted From: home  Disposition:  Home   Recommendations for Outpatient Follow-up:  1. Follow up with PCP in 1-2 weeks 2.  Follow-up with radiology as scheduled.  Home Health: RN. Equipment/Devices: None.  Discharge Condition: Stable. CODE STATUS: Full code. Diet recommendation: Heart healthy.  Brief/Interim Summary: Patient is 73 year old female who has never gone to a doctor .  No known chronic medical problems.  No recent medical follow-up.  She had been having some abdominal pain for about a week.  Pain was worsening so came to ER.  She was found to have ruptured appendicular abscess and rapid A. fib.  Also found to have renal insufficiency.  Discharge Diagnoses:  Active Problems:   Appendicitis   Hyponatremia   Hypokalemia   Dehydration   Sepsis (HCC)   Intraabdominal fluid collection   AKI (acute kidney injury) (HCC)   Atrial fibrillation with RVR (HCC)  Ruptured appendicular abscess: Status post percutaneous drain by IR. On regular diet.  Cultures grew E. coli and Bacteroides.  Initially treated with Zosyn and Rocephin.  Followed by surgery and IR. Repeat CT scan showed some persistent collection, probably loculated abscess. Minimal drain on the existing catheter. Clinically improving with no pain and normal bowel function. IR advised repeat imaging today with injection, and new drain placement under guidance, however patient stating that she had felt well declined the procedure.  Since patient declined the procedure and wanted to go home, she was prescribed Augmentin to continue, catheter care, flushing of the catheter.  She will be scheduled follow-up with IR where she will have repeat imaging. Patient was also advised to come back to ER immediately if any nausea, vomiting, abdominal pain, fever or  chills.  Paroxysmal A. fib with RVR: Converted to sinus rhythm.  Unknown whether new or old.  CHADSvasc2 score 2. risk of stroke 2.2/year.hAS-BLED score 2 points .Patient will benefit with therapeutic anticoagulation.  We discussed in detail about choices including different novel anticoagulant therapies.  After careful discussion, patient really does not want to start any blood thinners.  Patient is also not very optimistic whether she will continue to take even aspirin. I advised her at least to consider aspirin as suboptimal therapeutic options and she agreed.  Will start patient on aspirin 81 mg daily.  Acute renal failure: Improved after treatment.  Normalized.  Hypertension: Patient has underlying hypertension.  On Cardizem.  Advised to follow-up outpatient.   Discharge Instructions  Discharge Instructions    Call MD for:  redness, tenderness, or signs of infection (pain, swelling, redness, odor or green/yellow discharge around incision site)   Complete by:  As directed    Call MD for:  severe uncontrolled pain   Complete by:  As directed    Call MD for:  temperature >100.4   Complete by:  As directed    Diet - low sodium heart healthy   Complete by:  As directed    Discharge instructions   Complete by:  As directed    No heavy lifting or exertions for 2 weeks. Dressing changes, care of the drain as instructed.   Discharge wound care:   Complete by:  As directed    Keep it dry clean and covered.   Increase activity slowly   Complete by:  As directed      Allergies as of 05/17/2018  Reactions   Sulfa Antibiotics       Medication List    TAKE these medications   acetaminophen 325 MG tablet Commonly known as:  TYLENOL You can take 2 tablets every 4 hours as needed for pain.  You can buy this over-the-counter at any drugstore.  Do not exceed 4000 mg of Tylenol/acetaminophen per day.  This is your first choice for pain relief.  You can alternate this with ibuprofen at  home.   amoxicillin-clavulanate 875-125 MG tablet Commonly known as:  AUGMENTIN Take 1 tablet by mouth 2 (two) times daily for 10 days.   aspirin 81 MG EC tablet Take 1 tablet (81 mg total) by mouth daily. Start taking on:  May 18, 2018   diltiazem 240 MG 24 hr capsule Commonly known as:  CARDIZEM CD Take 1 capsule (240 mg total) by mouth daily. Start taking on:  May 18, 2018   ibuprofen 200 MG tablet Commonly known as:  ADVIL,MOTRIN Take 400 mg by mouth every 6 (six) hours as needed for moderate pain.   saccharomyces boulardii 250 MG capsule Commonly known as:  FLORASTOR This is a probiotic to help replace some of your colon bacteria.  You can buy this over-the-counter at any drugstore without a prescription.  Follow the package directions.  I would use it for  28 days.   sodium chloride flush 0.9 % Soln Commonly known as:  NS 5 mLs by Intracatheter route daily for 10 days.            Discharge Care Instructions  (From admission, onward)         Start     Ordered   05/17/18 0000  Discharge wound care:    Comments:  Keep it dry clean and covered.   05/17/18 1037         Follow-up Information    Darnell Level, MD Follow up on 06/01/2018.   Specialty:  General Surgery Why:  Your appointment is at 11:15 AM.  Be at the office 30 minutes early for check-in.  Bring photo ID and insurance information. Contact information: [address] [place] [address] [phone]        [facility] Follow up.   Why:  IR scheduler will call you with appointment date and time. Please call with questions or concerns.  Contact information: [address] [place] [address] [phone]          Allergies  Allergen Reactions  . Sulfa Antibiotics     Consultations:  Interventional radiology  General surgery   Procedures/Studies: Ct Abdomen Pelvis Wo Contrast  Result Date: 05/11/2018 CLINICAL DATA:  Right lower  abdominal pain with constipation and emesis EXAM: CT ABDOMEN AND PELVIS WITHOUT CONTRAST TECHNIQUE: Multidetector CT imaging of the abdomen and pelvis was performed following the standard protocol without IV contrast. COMPARISON:  None. FINDINGS: Lower chest: Lung bases demonstrate atelectasis at the right middle lobe and right base. Mild bronchiectasis at the right middle lobe and right base. Trace pericardial effusion. Heart size upper normal. Hepatobiliary: Calcified granuloma. No calcified gallstone or biliary dilatation Pancreas: Unremarkable. No pancreatic ductal dilatation or surrounding inflammatory changes. Spleen: Calcified granuloma Adrenals/Urinary Tract: Adrenal glands are within normal limits. Dilated left extrarenal pelvis versus parapelvic cysts. No hydroureter. No ureteral stone. Bladder is normal Stomach/Bowel: Stomach is nonenlarged. No dilated small bowel. Enlarged appendix measuring up to 15 mm in size with multiple appendicoliths. Significant right lower quadrant inflammatory changes. Vascular/Lymphatic:  Nonaneurysmal aorta. Moderate aortic atherosclerosis. No significantly enlarged lymph nodes Reproductive: Uterine calcification likely a fibroid. No adnexal mass Other: Small foci of extraluminal gas in the right lower quadrant. Gas and fluid collection inferior to the liver measures 7.4 x 5.2 x 3.4 cm. Contiguous with this is an additional right lower quadrant fluid collection measuring 5 x 3.1 x 9 cm. Smaller fluid collections anterior to the transverse colon measuring up to 2.1 cm. Musculoskeletal: Degenerative changes. Grade 1 anterolisthesis L4 on L5. No acute osseous abnormality IMPRESSION: 1. Findings consistent with acute perforated appendicitis. Small extraluminal gas locules in the right lower quadrant. Multiple gas and fluid collections extending from the inferior right hepatic lobe to the right lower quadrant consistent with intra-abdominal abscess. Appendix: Location: Right lower  quadrant Diameter: 15 mm Appendicolith: At least 3 appendicoliths Mucosal hyper-enhancement: No contrast given Extraluminal gas: Positive Periappendiceal collection: Positive Critical Value/emergent results were called by telephone at the time of interpretation on 05/11/2018 at 7:09 pm to Dr. Melene Plan , who verbally acknowledged these results. Electronically Signed   By: Jasmine Pang M.D.   On: 05/11/2018 19:09   Ct Abdomen Pelvis W Contrast  Result Date: 05/16/2018 CLINICAL DATA:  Follow-up of drain placement for ruptured appendix. EXAM: CT ABDOMEN AND PELVIS WITH CONTRAST TECHNIQUE: Multidetector CT imaging of the abdomen and pelvis was performed using the standard protocol following bolus administration of intravenous contrast. CONTRAST:  OMNIPAQUE IOHEXOL 300 MG/ML  SOLN COMPARISON:  Drain placement 05/12/2018. Preprocedure CT of 05/11/2018. FINDINGS: Lower chest: Right hemidiaphragm elevation. Similar right greater than left base airspace disease with air bronchograms, favoring atelectasis. Small right pleural effusion is new. Mild cardiomegaly, without pericardial effusion. 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 Normal gallbladder, without biliary ductal dilatation. Pancreas: Normal, without mass or ductal dilatation. Spleen: Old granulomatous disease within. Adrenals/Urinary Tract: Normal adrenal glands. Left greater than right renal sinus cysts. No hydronephrosis. Normal urinary bladder. Stomach/Bowel: Tiny hiatal hernia. Decompressed gastric body. Normal colon and terminal ileum. Fluid-filled appendix with appendicoliths within. Example at up to 11 mm on image 65/2. Periappendiceal edema, slightly improved. Normal small bowel. Vascular/Lymphatic: Aortic and branch vessel atherosclerosis. No abdominopelvic adenopathy. Reproductive: Left uterine body 8 mm calcification likely represents an underlying dystrophic fibroid. No adnexal mass. Other: Trace cul-de-sac fluid is new. A  percutaneous drain has been placed in the previously described upper right pelvic fluid collection. The posterior and inferior portion of this collection measures 3.0 x 2.4 cm on image 51/2. Compare 5.4 x 2.7 cm on the prior exam (when remeasured). Contiguous more cephalad and anterior component measures 2.6 x 3.4 cm on image 43/2. Compare 3.1 x 4.4 cm on the prior exam. medial to this, a somewhat less well-defined collection measures 2.9 x 3.2 cm on image 46/2. This measures on the order of 1.8 x 2.1 cm on the prior. Fat containing right inguinal hernia. Ventral abdominal wall laxity also contains fat. Musculoskeletal: Bilateral L5 pars defects. Trace L4-5 anterolisthesis. IMPRESSION: 1. Interval placement of a periappendiceal drain. The posterior and inferior component has decreased in size, as has a contiguous more anterior and cephalad component. A more medial anterior ill-defined fluid collection (possibly contiguous component) is increased compared to 05/11/2018. 2. Persistent appendicoliths and appendiceal dilatation, with decrease in periappendiceal edema. 3. New small right pleural effusion with persistent right greater than left base volume loss/atelectasis. 4.  Aortic Atherosclerosis (ICD10-I70.0). Electronically Signed   By: Jeronimo Greaves M.D.   On: 05/16/2018 19:26   Ct  Image Guided Drainage By Percutaneous Catheter  Result Date: 05/13/2018 INDICATION: Appendicitis and periappendiceal abscess EXAM: CT GUIDED DRAINAGE OF RIGHT LOWER QUADRANT ABSCESS MEDICATIONS: The patient is currently admitted to the hospital and receiving intravenous antibiotics. The antibiotics were administered within an appropriate time frame prior to the initiation of the procedure. ANESTHESIA/SEDATION: One mg IV Versed 50 mcg IV Fentanyl Moderate Sedation Time:  12 minutes The patient was continuously monitored during the procedure by the interventional radiology nurse under my direct supervision. COMPLICATIONS: None  immediate. TECHNIQUE: Informed written consent was obtained from the patient after a thorough discussion of the procedural risks, benefits and alternatives. All questions were addressed. Maximal Sterile Barrier Technique was utilized including caps, mask, sterile gowns, sterile gloves, sterile drape, hand hygiene and skin antiseptic. A timeout was performed prior to the initiation of the procedure. PROCEDURE: The right lower quadrant was prepped with ChloraPrep in a sterile fashion, and a sterile drape was applied covering the operative field. A sterile gown and sterile gloves were used for the procedure. Local anesthesia was provided with 1% Lidocaine. Under CT guidance, an 18 gauge needle was advanced into the right lower quadrant abscess. This was removed over an Amplatz wire. Ten [place] dilator followed by a 10 [place] drain were inserted. It was looped and string fixed then sewn to the skin. Frank pus was aspirated. FINDINGS: Imaging documents placement of a 10 French drain into a right lower quadrant abscess. IMPRESSION: Successful right lower quadrant abscess drain placement. Electronically Signed   By: Jolaine Click M.D.   On: 05/13/2018 07:57    IR guided drain placement.   Subjective: Patient was seen and examined.  Discussed with radiology, general surgery.  Patient has no complaints.  Repeat CT scan last night showed persistent abscess, however patient declined to have repeat drain placement and willing to go home.   Discharge Exam: Vitals:   05/16/18 2133 05/17/18 0457  BP: (!) 167/82 (!) 172/91  Pulse: 85 78  Resp: 17 17  Temp: 98.7 F (37.1 C) 97.8 F (36.6 C)  SpO2: 98% 97%   Vitals:   05/16/18 0504 05/16/18 1313 05/16/18 2133 05/17/18 0457  BP: (!) 161/86 (!) 150/77 (!) 167/82 (!) 172/91  Pulse: 78 86 85 78  Resp: Temp: 97.7 F (36.5 C) 98.3 F (36.8 C) 98.7 F (37.1 C) 97.8 F (36.6 C)  TempSrc: Oral Oral Oral Oral  SpO2: 98% 97% 98% 97%  Weight:       Height:        General: Pt is alert, awake, not in acute distress Cardiovascular: RRR, S1/S2 +, no rubs, no gallops Respiratory: CTA bilaterally, no wheezing, no rhonchi Abdominal: Soft, NT, ND, bowel sounds +, right lower quadrant drain with minimal cloudy drain. Extremities: no edema, no cyanosis    The results of significant diagnostics from this hospitalization (including imaging, microbiology, ancillary and laboratory) are listed below for reference.     Microbiology: Recent Results (from the past 240 hour(s))  Culture, Urine     Status: None   Collection Time: 05/11/18  6:47 PM  Result Value Ref Range Status   Specimen Description   Final    URINE, CLEAN CATCH Performed at [facility], [address] [place], [place] [address]    Special Requests   Final    NONE Performed at [facility], [address], [place], [place] [address]    Culture   Final  Multiple bacterial morphotypes present, none predominant. Suggest appropriate recollection if clinically indicated.   Report Status 05/13/2018 FINAL  Final  Surgical pcr screen     Status: None   Collection Time: 05/12/18  4:11 AM  Result Value Ref Range Status   MRSA, PCR NEGATIVE NEGATIVE Final   Staphylococcus aureus NEGATIVE NEGATIVE Final    Comment: (NOTE) The Xpert SA Assay (FDA approved for NASAL specimens in patients 54 years of age and older), is one component of a comprehensive surveillance program. It is not intended to diagnose infection nor to guide or monitor treatment. Performed at [facility], [address] [place], [place] [address]   Culture, blood (x 2)     Status: None (Preliminary result)   Collection Time: 05/12/18  6:22 AM  Result Value Ref Range Status   Specimen Description   Final    BLOOD LEFT HAND Performed at [facility], [address] [place], [place] [address]    Special Requests   Final     BOTTLES DRAWN AEROBIC AND ANAEROBIC Blood Culture adequate volume Performed at [facility], [address] [place], [place] [address]    Culture   Final    NO GROWTH 4 DAYS Performed at [facility], [address] [place], [place] [address]    Report Status PENDING  Incomplete  Culture, blood (x 2)     Status: None (Preliminary result)   Collection Time: 05/12/18  6:27 AM  Result Value Ref Range Status   Specimen Description   Final    BLOOD RIGHT ANTECUBITAL Performed at [facility], [address], [place], [place] [address]    Special Requests   Final    BOTTLES DRAWN AEROBIC AND ANAEROBIC Blood Culture results may not be optimal due to an excessive volume of blood received in culture bottles Performed at [facility], [address] [place], [place] [address]    Culture   Final    NO GROWTH 4 DAYS Performed at [facility], [address] [place], [place] [address]    Report Status PENDING  Incomplete  Aerobic/Anaerobic Culture (surgical/deep wound)     Status: None   Collection Time: 05/12/18  5:34 PM  Result Value Ref Range Status   Specimen Description   Final    ABDOMEN Performed at [facility], [address] [place], [place] [address]    Special Requests   Final    NONE Performed at [facility], [address] [place], [place] [address]    Gram Stain   Final    ABUNDANT WBC PRESENT,BOTH PMN AND MONONUCLEAR GRAM POSITIVE COCCI IN PAIRS IN CLUSTERS ABUNDANT GRAM NEGATIVE RODS Performed at [facility], [address] [place], [place] [address]    Culture   Final    MODERATE ESCHERICHIA COLI ABUNDANT BACTEROIDES FRAGILIS BETA LACTAMASE POSITIVE MODERATE STREPTOCOCCUS GROUP C    Report Status 05/16/2018 FINAL  Final   Organism ID, Bacteria ESCHERICHIA COLI  Final      Susceptibility   Escherichia coli - MIC*    AMPICILLIN 8 SENSITIVE  Sensitive     CEFAZOLIN <=4 SENSITIVE Sensitive     CEFEPIME <=1 SENSITIVE Sensitive     CEFTAZIDIME <=1 SENSITIVE Sensitive     CEFTRIAXONE <=1 SENSITIVE Sensitive     CIPROFLOXACIN <=0.25 SENSITIVE Sensitive     GENTAMICIN <=1 SENSITIVE Sensitive  IMIPENEM <=0.25 SENSITIVE Sensitive     TRIMETH/SULFA <=20 SENSITIVE Sensitive     AMPICILLIN/SULBACTAM <=2 SENSITIVE Sensitive     PIP/TAZO <=4 SENSITIVE Sensitive     Extended ESBL NEGATIVE Sensitive     * MODERATE ESCHERICHIA COLI  MRSA PCR Screening     Status: None   Collection Time: 05/12/18  6:04 PM  Result Value Ref Range Status   MRSA by PCR NEGATIVE NEGATIVE Final    Comment:        The GeneXpert MRSA Assay (FDA approved for NASAL specimens only), is one component of a comprehensive MRSA colonization surveillance program. It is not intended to diagnose MRSA infection nor to guide or monitor treatment for MRSA infections. Performed at [facility], [address] [place], [place] [id number]      Labs: BNP (last 3 results) No results for input(s): BNP in the last 8760 hours. Basic Metabolic Panel: Recent Labs  Lab 05/11/18 1646 05/11/18 1723 05/12/18 0622 05/13/18 0248 05/14/18 0601 05/16/18 0529  NA 132*  --  135 137 141 137  K 3.3*  --  3.1* 2.9* 3.9 4.2  CL 93*  --  102 108 110 105  CO2 22  --  20* 20* 23 23  GLUCOSE 115*  --  104* 117* 106* 101*  BUN 73*  --  64* 46* 26* 12  CREATININE 2.53* 2.70* 1.95* 1.44* 1.00 0.76  CALCIUM 8.6*  --  7.5* 7.5* 8.4* 8.1*  MG 2.7*  --  2.4  --   --   --   PHOS  --   --  3.6  --   --   --    Liver Function Tests: Recent Labs  Lab 05/11/18 1646 05/12/18 0622  AST 26 13*  ALT 76* 63*  ALKPHOS 238* 197*  BILITOT 0.9 0.5  PROT 7.1 6.0*  ALBUMIN 2.8* 2.2*   Recent Labs  Lab 05/11/18 1646  LIPASE 36   No results for input(s): AMMONIA in the last 168 hours. CBC: Recent Labs  Lab 05/12/18 0622 05/13/18 0248 05/14/18 0601 05/15/18 0552  05/16/18 0529  WBC 26.5* 20.5* 18.2* 15.7* 16.4*  NEUTROABS  --  15.8* 13.4*  --  13.6*  HGB 11.1* 10.5* 11.3* 11.9* 11.3*  HCT 33.5* 32.9* 36.6 36.8 36.7  MCV 92.5 95.4 96.3 94.4 97.1  PLT 266 261 344 339 320   Cardiac Enzymes: No results for input(s): CKTOTAL, CKMB, CKMBINDEX, TROPONINI in the last 168 hours. BNP: Invalid input(s): POCBNP CBG: Recent Labs  Lab 05/16/18 0737  GLUCAP 85   D-Dimer No results for input(s): DDIMER in the last 72 hours. Hgb A1c No results for input(s): HGBA1C in the last 72 hours. Lipid Profile No results for input(s): CHOL, HDL, LDLCALC, TRIG, CHOLHDL, LDLDIRECT in the last 72 hours. Thyroid function studies No results for input(s): TSH, T4TOTAL, T3FREE, THYROIDAB in the last 72 hours.  Invalid input(s): FREET3 Anemia work up No results for input(s): VITAMINB12, FOLATE, FERRITIN, TIBC, IRON, RETICCTPCT in the last 72 hours. Urinalysis    Component Value Date/Time   COLORURINE AMBER (A) 05/11/2018 1646   APPEARANCEUR CLOUDY (A) 05/11/2018 1646   LABSPEC 1.017 05/11/2018 1646   PHURINE 5.0 05/11/2018 1646   GLUCOSEU NEGATIVE 05/11/2018 1646   HGBUR MODERATE (A) 05/11/2018 1646   BILIRUBINUR NEGATIVE 05/11/2018 1646   KETONESUR NEGATIVE 05/11/2018 1646   PROTEINUR 30 (A) 05/11/2018 1646   NITRITE NEGATIVE 05/11/2018 1646   LEUKOCYTESUR LARGE (A) 05/11/2018 1646  Sepsis Labs Invalid input(s): PROCALCITONIN,  WBC,  LACTICIDVEN Microbiology Recent Results (from the past 240 hour(s))  Culture, Urine     Status: None   Collection Time: 05/11/18  6:47 PM  Result Value Ref Range Status   Specimen Description   Final    URINE, CLEAN CATCH Performed at [facility], [address], [place], [place] [address]    Special Requests   Final    NONE Performed at [facility], [address] [place], [place] [address]    Culture   Final    Multiple bacterial morphotypes present, none predominant. Suggest  appropriate recollection if clinically indicated.   Report Status 05/13/2018 FINAL  Final  Surgical pcr screen     Status: None   Collection Time: 05/12/18  4:11 AM  Result Value Ref Range Status   MRSA, PCR NEGATIVE NEGATIVE Final   Staphylococcus aureus NEGATIVE NEGATIVE Final    Comment: (NOTE) The Xpert SA Assay (FDA approved for NASAL specimens in patients 21 years of age and older), is one component of a comprehensive surveillance program. It is not intended to diagnose infection nor to guide or monitor treatment. Performed at [facility], [address] [place], [place] [address]   Culture, blood (x 2)     Status: None (Preliminary result)   Collection Time: 05/12/18  6:22 AM  Result Value Ref Range Status   Specimen Description   Final    BLOOD LEFT HAND Performed at [facility], [address], [place], [place] [address]    Special Requests   Final    BOTTLES DRAWN AEROBIC AND ANAEROBIC Blood Culture adequate volume Performed at [facility], [address] [place], [place] [address]    Culture   Final    NO GROWTH 4 DAYS Performed at [facility], [address] [place], [place] [address]    Report Status PENDING  Incomplete  Culture, blood (x 2)     Status: None (Preliminary result)   Collection Time: 05/12/18  6:27 AM  Result Value Ref Range Status   Specimen Description   Final    BLOOD RIGHT ANTECUBITAL Performed at [facility], [address], [place], [place] [address]    Special Requests   Final    BOTTLES DRAWN AEROBIC AND ANAEROBIC Blood Culture results may not be optimal due to an excessive volume of blood received in culture bottles Performed at [facility], [address], [place], [place] [address]    Culture   Final    NO GROWTH 4 DAYS Performed at [facility], [address] [place], [place] [address]    Report Status PENDING   Incomplete  Aerobic/Anaerobic Culture (surgical/deep wound)     Status: None   Collection Time: 05/12/18  5:34 PM  Result Value Ref Range Status   Specimen Description   Final    ABDOMEN Performed at [facility], [address], [place], [place] [address]    Special Requests   Final    NONE Performed at [facility], [address] [place], [place] [address]    Gram Stain   Final    ABUNDANT WBC PRESENT,BOTH PMN AND MONONUCLEAR GRAM POSITIVE COCCI IN PAIRS IN CLUSTERS ABUNDANT GRAM NEGATIVE RODS Performed at [facility], [address], [place], [place] [address]    Culture   Final    MODERATE  ESCHERICHIA COLI ABUNDANT BACTEROIDES FRAGILIS BETA LACTAMASE POSITIVE MODERATE [facility]    Report Status 05/16/2018 FINAL  Final   Organism ID, Bacteria ESCHERICHIA COLI  Final      Susceptibility   Escherichia coli - MIC*    AMPICILLIN 8 SENSITIVE Sensitive     CEFAZOLIN <=4 SENSITIVE Sensitive     CEFEPIME <=1 SENSITIVE Sensitive     CEFTAZIDIME <=1 SENSITIVE Sensitive     CEFTRIAXONE <=1 SENSITIVE Sensitive     CIPROFLOXACIN <=0.25 SENSITIVE Sensitive     GENTAMICIN <=1 SENSITIVE Sensitive     IMIPENEM <=0.25 SENSITIVE Sensitive     TRIMETH/SULFA <=20 SENSITIVE Sensitive     AMPICILLIN/SULBACTAM <=2 SENSITIVE Sensitive     PIP/TAZO <=4 SENSITIVE Sensitive     Extended ESBL NEGATIVE Sensitive     * MODERATE ESCHERICHIA COLI  MRSA PCR Screening     Status: None   Collection Time: 05/12/18  6:04 PM  Result Value Ref Range Status   MRSA by PCR NEGATIVE NEGATIVE Final    Comment:        The GeneXpert MRSA Assay (FDA approved for NASAL specimens only), is one component of a comprehensive MRSA colonization surveillance program. It is not intended to diagnose MRSA infection nor to guide or monitor treatment for MRSA infections. Performed at [facility], [address], [place], [place] [address]       Time coordinating discharge:  32 minutes  SIGNED:   Dorcas Carrow, MD  [facility] 05/17/2018, 11:26 AM Pager [phone]  If 7PM-7AM, please contact night-coverage [email or web address] Password [email or web address]

## 2018-05-18 DIAGNOSIS — I4891 Unspecified atrial fibrillation: Secondary | ICD-10-CM | POA: Diagnosis not present

## 2018-05-18 DIAGNOSIS — Z438 Encounter for attention to other artificial openings: Secondary | ICD-10-CM | POA: Diagnosis not present

## 2018-05-18 DIAGNOSIS — K3533 Acute appendicitis with perforation and localized peritonitis, with abscess: Secondary | ICD-10-CM | POA: Diagnosis not present

## 2018-05-19 DIAGNOSIS — K3533 Acute appendicitis with perforation and localized peritonitis, with abscess: Secondary | ICD-10-CM | POA: Diagnosis not present

## 2018-05-19 DIAGNOSIS — Z438 Encounter for attention to other artificial openings: Secondary | ICD-10-CM | POA: Diagnosis not present

## 2018-05-19 DIAGNOSIS — I4891 Unspecified atrial fibrillation: Secondary | ICD-10-CM | POA: Diagnosis not present

## 2018-05-20 DIAGNOSIS — Z438 Encounter for attention to other artificial openings: Secondary | ICD-10-CM | POA: Diagnosis not present

## 2018-05-20 DIAGNOSIS — I4891 Unspecified atrial fibrillation: Secondary | ICD-10-CM | POA: Diagnosis not present

## 2018-05-20 DIAGNOSIS — K3533 Acute appendicitis with perforation and localized peritonitis, with abscess: Secondary | ICD-10-CM | POA: Diagnosis not present

## 2018-05-21 DIAGNOSIS — K3533 Acute appendicitis with perforation and localized peritonitis, with abscess: Secondary | ICD-10-CM | POA: Diagnosis not present

## 2018-05-21 DIAGNOSIS — I4891 Unspecified atrial fibrillation: Secondary | ICD-10-CM | POA: Diagnosis not present

## 2018-05-21 DIAGNOSIS — Z438 Encounter for attention to other artificial openings: Secondary | ICD-10-CM | POA: Diagnosis not present

## 2018-05-23 ENCOUNTER — Other Ambulatory Visit: Payer: Self-pay | Admitting: Surgery

## 2018-05-23 DIAGNOSIS — K3532 Acute appendicitis with perforation and localized peritonitis, without abscess: Secondary | ICD-10-CM

## 2018-05-24 DIAGNOSIS — Z438 Encounter for attention to other artificial openings: Secondary | ICD-10-CM | POA: Diagnosis not present

## 2018-05-24 DIAGNOSIS — I4891 Unspecified atrial fibrillation: Secondary | ICD-10-CM | POA: Diagnosis not present

## 2018-05-24 DIAGNOSIS — K3533 Acute appendicitis with perforation and localized peritonitis, with abscess: Secondary | ICD-10-CM | POA: Diagnosis not present

## 2018-05-26 DIAGNOSIS — K3533 Acute appendicitis with perforation and localized peritonitis, with abscess: Secondary | ICD-10-CM | POA: Diagnosis not present

## 2018-05-26 DIAGNOSIS — I4891 Unspecified atrial fibrillation: Secondary | ICD-10-CM | POA: Diagnosis not present

## 2018-05-26 DIAGNOSIS — Z438 Encounter for attention to other artificial openings: Secondary | ICD-10-CM | POA: Diagnosis not present

## 2018-05-31 DIAGNOSIS — K3533 Acute appendicitis with perforation and localized peritonitis, with abscess: Secondary | ICD-10-CM | POA: Diagnosis not present

## 2018-05-31 DIAGNOSIS — I4891 Unspecified atrial fibrillation: Secondary | ICD-10-CM | POA: Diagnosis not present

## 2018-05-31 DIAGNOSIS — Z438 Encounter for attention to other artificial openings: Secondary | ICD-10-CM | POA: Diagnosis not present

## 2018-06-01 ENCOUNTER — Other Ambulatory Visit: Payer: Self-pay

## 2018-06-01 ENCOUNTER — Ambulatory Visit
Admission: RE | Admit: 2018-06-01 | Discharge: 2018-06-01 | Disposition: A | Discharge status: Home / Self Care | Payer: Medicare Other | Source: Ambulatory Visit | Attending: Surgery | Admitting: Surgery

## 2018-06-01 ENCOUNTER — Ambulatory Visit
Admission: RE | Admit: 2018-06-01 | Discharge: 2018-06-01 | Disposition: A | Discharge status: Home / Self Care | Payer: Medicare Other | Source: Ambulatory Visit | Attending: Physician Assistant | Admitting: Physician Assistant

## 2018-06-01 ENCOUNTER — Encounter: Payer: Self-pay | Admitting: Interventional Radiology

## 2018-06-01 DIAGNOSIS — K3532 Acute appendicitis with perforation and localized peritonitis, without abscess: Secondary | ICD-10-CM

## 2018-06-01 DIAGNOSIS — K3533 Acute appendicitis with perforation and localized peritonitis, with abscess: Secondary | ICD-10-CM | POA: Diagnosis not present

## 2018-06-01 HISTORY — PX: IR RADIOLOGIST EVAL & MGMT: IMG5224

## 2018-06-01 MED ORDER — IOPAMIDOL (ISOVUE-300) INJECTION 61%
100.0000 mL | Freq: Once | INTRAVENOUS | Status: AC | PRN
  Administered 2018-06-01: 100 mL via INTRAVENOUS

## 2018-06-01 NOTE — Progress Notes (Signed)
Referring Physician(s): Watterson,Shannon A  Chief Complaint: The patient is seen in follow up today s/p appendiceal abscess drain  History of present illness: Monique Castillo is a 73 year old female with recent perforated appendicitis and periappendiceal abscess s/p drain placement 05/12/18 with Dr. Bonnielee HaffHoss. She ultimately improved enough for discharge home with drain in place.  She presents to IR drain clinic today for evaluation and management of her intra-abdominal fluid collection and drain.   Patient presents to clinic today in improved condition. She has been flushing the drain. She endorses a small amount of leaking around the tubing with flushes. She denies any symptoms or concerns today.   No past medical history on file.  Past Surgical History:  Procedure Laterality Date  . PARTIAL HYSTERECTOMY      Allergies: Sulfa antibiotics  Medications: Prior to Admission medications   Medication Sig Start Date End Date Taking? Authorizing Provider  acetaminophen (TYLENOL) 325 MG tablet You can take 2 tablets every 4 hours as needed for pain.  You can buy this over-the-counter at any drugstore.  Do not exceed 4000 mg of Tylenol/acetaminophen per day.  This is your first choice for pain relief.  You can alternate this with ibuprofen at home. 05/16/18   Sherrie GeorgeJennings, Willard, PA-C  aspirin EC 81 MG EC tablet Take 1 tablet (81 mg total) by mouth daily. 05/18/18   Dorcas CarrowGhimire, Kuber, MD  diltiazem (CARDIZEM CD) 240 MG 24 hr capsule Take 1 capsule (240 mg total) by mouth daily. 05/18/18 08/16/18  Dorcas CarrowGhimire, Kuber, MD  ibuprofen (ADVIL,MOTRIN) 200 MG tablet Take 400 mg by mouth every 6 (six) hours as needed for moderate pain.    [provider]  saccharomyces boulardii (FLORASTOR) 250 MG capsule This is a probiotic to help replace some of your colon bacteria.  You can buy this over-the-counter at any drugstore without a prescription.  Follow the package directions.  I would use it for  28 days. 05/16/18    Sherrie GeorgeJennings, Willard, PA-C     Family History  Problem Relation Age of Onset  . Seizures Father   . Ulcers Father   . Heart failure Father     Social History   Socioeconomic History  . Marital status: Married    Spouse name: Not on file  . Number of children: Not on file  . Years of education: Not on file  . Highest education level: Not on file  Occupational History  . Not on file  Social Needs  . Financial resource strain: Not on file  . Food insecurity:    Worry: Not on file    Inability: Not on file  . Transportation needs:    Medical: Not on file    Non-medical: Not on file  Tobacco Use  . Smoking status: Never Smoker  . Smokeless tobacco: Never Used  Substance and Sexual Activity  . Alcohol use: Yes    Comment: 1 glass wine daily  . Drug use: Never  . Sexual activity: Not on file  Lifestyle  . Physical activity:    Days per week: Not on file    Minutes per session: Not on file  . Stress: Not on file  Relationships  . Social connections:    Talks on phone: Not on file    Gets together: Not on file    Attends religious service: Not on file    Active member of club or organization: Not on file    Attends meetings of clubs or organizations: Not  on file    Relationship status: Not on file  Other Topics Concern  . Not on file  Social History Narrative  . Not on file     Vital Signs: BP (!) 189/93   Pulse (!) 125   Temp 98.2 F (36.8 C)   SpO2 97%   Physical Exam  Abdomen:  RLQ drain in place.   Imaging: No results found.  Labs:  CBC: Recent Labs    05/13/18 0248 05/14/18 0601 05/15/18 0552 05/16/18 0529  WBC 20.5* 18.2* 15.7* 16.4*  HGB 10.5* 11.3* 11.9* 11.3*  HCT 32.9* 36.6 36.8 36.7  PLT 261 344 339 320    COAGS: Recent Labs    05/12/18 0622  INR 1.1  APTT 26    BMP: Recent Labs    05/12/18 0622 05/13/18 0248 05/14/18 0601 05/16/18 0529  NA 135 137 141 137  K 3.1* 2.9* 3.9 4.2  CL 102 108 110 105  CO2 20* 20* 23 23   GLUCOSE 104* 117* 106* 101*  BUN 64* 46* 26* 12  CALCIUM 7.5* 7.5* 8.4* 8.1*  CREATININE 1.95* 1.44* 1.00 0.76  GFRNONAA 25* 36* 57* >60  GFRAA 29* 42* >60 >60    LIVER FUNCTION TESTS: Recent Labs    05/11/18 1646 05/12/18 0622  BILITOT 0.9 0.5  AST 26 13*  ALT 76* 63*  ALKPHOS 238* 197*  PROT 7.1 6.0*  ALBUMIN 2.8* 2.2*    Assessment: Ruptured appendix with intra-abdominal abscesses s/p percutaneous drainage 05/12/18 Patient presents for follow-up of her abdominal drain today.  She denies complaint and is motivate for drain removal today if possible.  Of note, she underwent CT Abdomen/Pelvis on day of discharge from the hosptial, 05/17/18, which showed she may benefit from additional drain placement.  She refused.  Her CT imaging is repeated today and reviewed by Dr. Loreta Ave.  Please see attestation for care plan and patient discussion.  Patient has plans for follow-up with surgery today.    Signed: Hoyt Koch, PA 06/01/2018, 11:05 AM   Please refer to Dr. Loreta Ave attestation of this note for management and plan.

## 2018-10-20 ENCOUNTER — Other Ambulatory Visit: Payer: Self-pay

## 2018-10-20 ENCOUNTER — Emergency Department (HOSPITAL_COMMUNITY): Payer: Medicare Other

## 2018-10-20 ENCOUNTER — Encounter (HOSPITAL_COMMUNITY): Payer: Self-pay

## 2018-10-20 ENCOUNTER — Inpatient Hospital Stay (HOSPITAL_COMMUNITY)
Admission: EM | Admit: 2018-10-20 | Discharge: 2018-10-24 | DRG: 339 | Disposition: A | Discharge status: Home / Self Care | Payer: Medicare Other | Attending: General Surgery | Admitting: General Surgery

## 2018-10-20 DIAGNOSIS — Z20828 Contact with and (suspected) exposure to other viral communicable diseases: Secondary | ICD-10-CM | POA: Diagnosis not present

## 2018-10-20 DIAGNOSIS — I4891 Unspecified atrial fibrillation: Secondary | ICD-10-CM | POA: Diagnosis present

## 2018-10-20 DIAGNOSIS — K3533 Acute appendicitis with perforation and localized peritonitis, with abscess: Principal | ICD-10-CM | POA: Diagnosis present

## 2018-10-20 DIAGNOSIS — I10 Essential (primary) hypertension: Secondary | ICD-10-CM | POA: Diagnosis not present

## 2018-10-20 DIAGNOSIS — Z03818 Encounter for observation for suspected exposure to other biological agents ruled out: Secondary | ICD-10-CM | POA: Diagnosis not present

## 2018-10-20 DIAGNOSIS — K3580 Unspecified acute appendicitis: Secondary | ICD-10-CM | POA: Diagnosis present

## 2018-10-20 DIAGNOSIS — I160 Hypertensive urgency: Secondary | ICD-10-CM

## 2018-10-20 DIAGNOSIS — R1031 Right lower quadrant pain: Secondary | ICD-10-CM | POA: Diagnosis not present

## 2018-10-20 DIAGNOSIS — I471 Supraventricular tachycardia: Secondary | ICD-10-CM | POA: Diagnosis not present

## 2018-10-20 DIAGNOSIS — I4719 Other supraventricular tachycardia: Secondary | ICD-10-CM | POA: Diagnosis present

## 2018-10-20 LAB — COMPREHENSIVE METABOLIC PANEL
ALT: 27 U/L (ref 0–44)
AST: 16 U/L (ref 15–41)
Albumin: 4 g/dL (ref 3.5–5.0)
Alkaline Phosphatase: 58 U/L (ref 38–126)
Anion gap: 12 (ref 5–15)
BUN: 17 mg/dL (ref 8–23)
CO2: 23 mmol/L (ref 22–32)
Calcium: 9.4 mg/dL (ref 8.9–10.3)
Chloride: 104 mmol/L (ref 98–111)
Creatinine, Ser: 0.95 mg/dL (ref 0.44–1.00)
GFR calc Af Amer: >60 mL/min (ref >60)
GFR calc non Af Amer: 60 mL/min — ABNORMAL LOW (ref >60)
Glucose, Bld: 136 mg/dL — ABNORMAL HIGH (ref 70–99)
Potassium: 3.7 mmol/L (ref 3.5–5.1)
Sodium: 139 mmol/L (ref 135–145)
Total Bilirubin: 1.3 mg/dL — ABNORMAL HIGH (ref 0.3–1.2)
Total Protein: 7.6 g/dL (ref 6.5–8.1)

## 2018-10-20 LAB — CBC
HCT: 42.4 % (ref 36.0–46.0)
Hemoglobin: 13.6 g/dL (ref 12.0–15.0)
MCH: 30.2 pg (ref 26.0–34.0)
MCHC: 32.1 g/dL (ref 30.0–36.0)
MCV: 94.2 fL (ref 80.0–100.0)
Platelets: 245 10*3/uL (ref 150–400)
RBC: 4.5 MIL/uL (ref 3.87–5.11)
RDW: 12.8 % (ref 11.5–15.5)
WBC: 22 10*3/uL — ABNORMAL HIGH (ref 4.0–10.5)
nRBC: 0 % (ref 0.0–0.2)

## 2018-10-20 LAB — URINALYSIS, ROUTINE W REFLEX MICROSCOPIC
Bacteria, UA: NONE SEEN
Bilirubin Urine: NEGATIVE
Glucose, UA: NEGATIVE mg/dL
Hgb urine dipstick: NEGATIVE
Ketones, ur: 5 mg/dL — AB
Nitrite: NEGATIVE
Protein, ur: 100 mg/dL — AB
Specific Gravity, Urine: 1.026 (ref 1.005–1.030)
pH: 5 (ref 5.0–8.0)

## 2018-10-20 LAB — LIPASE, BLOOD: Lipase: 28 U/L (ref 11–51)

## 2018-10-20 LAB — LACTIC ACID, PLASMA: Lactic Acid, Venous: 1.6 mmol/L (ref 0.5–1.9)

## 2018-10-20 MED ORDER — PIPERACILLIN-TAZOBACTAM 3.375 G IVPB 30 MIN
3.3750 g | Freq: Once | INTRAVENOUS | Status: AC
  Administered 2018-10-20: 3.375 g via INTRAVENOUS
  Filled 2018-10-20: qty 50

## 2018-10-20 MED ORDER — ONDANSETRON HCL 4 MG/2ML IJ SOLN
4.0000 mg | Freq: Once | INTRAMUSCULAR | Status: AC
  Administered 2018-10-20: 4 mg via INTRAVENOUS
  Filled 2018-10-20: qty 2

## 2018-10-20 MED ORDER — IOHEXOL 300 MG/ML  SOLN
100.0000 mL | Freq: Once | INTRAMUSCULAR | Status: AC | PRN
  Administered 2018-10-20: 100 mL via INTRAVENOUS

## 2018-10-20 MED ORDER — SODIUM CHLORIDE (PF) 0.9 % IJ SOLN
INTRAMUSCULAR | Status: AC
  Administered 2018-10-20: 23:00:00
  Filled 2018-10-20: qty 50

## 2018-10-20 MED ORDER — HYDROMORPHONE HCL 1 MG/ML IJ SOLN
0.5000 mg | Freq: Once | INTRAMUSCULAR | Status: AC
  Administered 2018-10-20: 23:00:00 0.5 mg via INTRAVENOUS
  Filled 2018-10-20: qty 1

## 2018-10-20 MED ORDER — SODIUM CHLORIDE 0.9% FLUSH
3.0000 mL | Freq: Once | INTRAVENOUS | Status: AC
  Administered 2018-10-20: 3 mL via INTRAVENOUS

## 2018-10-20 MED ORDER — SODIUM CHLORIDE 0.9 % IV BOLUS
500.0000 mL | Freq: Once | INTRAVENOUS | Status: AC
  Administered 2018-10-20: 500 mL via INTRAVENOUS

## 2018-10-20 NOTE — ED Provider Notes (Signed)
[facility]-EMERGENCY DEPT Provider Note   CSN: [id number] Arrival date & time: 10/20/18  1715     History   Chief Complaint Chief Complaint  Patient presents with   Abdominal Pain    HPI Nicholina Winschel is a 73 y.o. female.     Patient with history of atrial fibrillation on diltiazem, history of partial hysterectomy, perforated appendix 04/2018 complicated by intra-abdominal abscesses, drain placed and removed in 05/2018 after resolution of symptoms and abscesses, due to COVID-19 pandemic patient was unable to get her appendix out as an outpatient.  Patient had recurrent symptoms, similar to previous appendicitis starting this morning.  Patient was able to work 1/2-day but the pain gradually became worse and she had to go home.  She reports decreased appetite and has not eaten since this morning.  No fevers, chest pain or shortness of breath.  Pain is worse with palpation and movement.  Nothing makes symptoms better.     History reviewed. No pertinent past medical history.  Patient Active Problem List   Diagnosis Date Noted   Appendicitis 05/11/2018   Hyponatremia 05/11/2018   Hypokalemia 05/11/2018   Dehydration 05/11/2018   Sepsis (HCC) 05/11/2018   Intraabdominal fluid collection 05/11/2018   AKI (acute kidney injury) (HCC) 05/11/2018   Atrial fibrillation with RVR (HCC) 05/11/2018    Past Surgical History:  Procedure Laterality Date   IR RADIOLOGIST EVAL & MGMT  06/01/2018   PARTIAL HYSTERECTOMY       OB History   No obstetric history on file.      Home Medications    Prior to Admission medications   Medication Sig Start Date End Date Taking? Authorizing Provider  ibuprofen (ADVIL,MOTRIN) 200 MG tablet Take 400 mg by mouth every 6 (six) hours as needed for moderate pain.   Yes [provider]  acetaminophen (TYLENOL) 325 MG tablet You can take 2 tablets every 4 hours as needed for pain.  You can buy this over-the-counter at  any drugstore.  Do not exceed 4000 mg of Tylenol/acetaminophen per day.  This is your first choice for pain relief.  You can alternate this with ibuprofen at home. Patient not taking: Reported on 10/20/2018 05/16/18   Sherrie George, PA-C  aspirin EC 81 MG EC tablet Take 1 tablet (81 mg total) by mouth daily. Patient not taking: Reported on 10/20/2018 05/18/18   Dorcas Carrow, MD  diltiazem (CARDIZEM CD) 240 MG 24 hr capsule Take 1 capsule (240 mg total) by mouth daily. Patient not taking: Reported on 10/20/2018 05/18/18 08/16/18  Dorcas Carrow, MD  saccharomyces boulardii (FLORASTOR) 250 MG capsule This is a probiotic to help replace some of your colon bacteria.  You can buy this over-the-counter at any drugstore without a prescription.  Follow the package directions.  I would use it for  28 days. Patient not taking: Reported on 10/20/2018 05/16/18   Sherrie George, PA-C    Family History Family History  Problem Relation Age of Onset   Seizures Father    Ulcers Father    Heart failure Father     Social History Social History   Tobacco Use   Smoking status: Never Smoker   Smokeless tobacco: Never Used  Substance Use Topics   Alcohol use: Yes    Comment: 1 glass wine daily   Drug use: Never     Allergies   Sulfa antibiotics   Review of Systems Review of Systems  Constitutional: Positive for appetite change. Negative for fever.  HENT: Negative for rhinorrhea and sore throat.   Eyes: Negative for redness.  Respiratory: Negative for cough.   Cardiovascular: Negative for chest pain.  Gastrointestinal: Positive for abdominal pain. Negative for diarrhea, nausea and vomiting.  Genitourinary: Negative for dysuria.  Musculoskeletal: Negative for myalgias.  Skin: Negative for rash.  Neurological: Negative for headaches.     Physical Exam Updated Vital Signs BP (!) 166/110 (BP Location: Left Arm)    Pulse (!) 116    Temp 98.7 F (37.1 C) (Oral)    Resp 18    Ht 5\' 5"   (1.651 m)    Wt 74.4 kg    SpO2 95%    BMI 27.29 kg/m   Physical Exam Vitals signs and nursing note reviewed.  Constitutional:      Appearance: She is well-developed.  HENT:     Head: Normocephalic and atraumatic.  Eyes:     General:        Right eye: No discharge.        Left eye: No discharge.     Conjunctiva/sclera: Conjunctivae normal.  Neck:     Musculoskeletal: Normal range of motion and neck supple.  Cardiovascular:     Rate and Rhythm: Regular rhythm. Tachycardia present.     Heart sounds: Normal heart sounds.  Pulmonary:     Effort: Pulmonary effort is normal.     Breath sounds: Normal breath sounds.  Abdominal:     Palpations: Abdomen is soft.     Tenderness: There is abdominal tenderness (moderate, worse RLQ). There is no guarding or rebound. Positive signs include McBurney's sign and psoas sign. Negative signs include Murphy's sign and Rovsing's sign.  Skin:    General: Skin is warm and dry.  Neurological:     Mental Status: She is alert.      ED Treatments / Results  Labs (all labs ordered are listed, but only abnormal results are displayed) Labs Reviewed  COMPREHENSIVE METABOLIC PANEL - Abnormal; Notable for the following components:      Result Value   Glucose, Bld 136 (*)    Total Bilirubin 1.3 (*)    GFR calc non Af Amer 60 (*)    All other components within normal limits  CBC - Abnormal; Notable for the following components:   WBC 22.0 (*)    All other components within normal limits  URINALYSIS, ROUTINE W REFLEX MICROSCOPIC - Abnormal; Notable for the following components:   Color, Urine AMBER (*)    APPearance HAZY (*)    Ketones, ur 5 (*)    Protein, ur 100 (*)    Leukocytes,Ua TRACE (*)    All other components within normal limits  CULTURE, BLOOD (ROUTINE X 2)  CULTURE, BLOOD (ROUTINE X 2)  SARS CORONAVIRUS 2 (HOSPITAL ORDER, PERFORMED IN [facility])  LIPASE, BLOOD  LACTIC ACID, PLASMA    EKG None  Radiology Ct  Abdomen Pelvis W Contrast  Result Date: 10/20/2018 CLINICAL DATA:  Abdominal pain, right lower quadrant pain EXAM: CT ABDOMEN AND PELVIS WITH CONTRAST TECHNIQUE: Multidetector CT imaging of the abdomen and pelvis was performed using the standard protocol following bolus administration of intravenous contrast. CONTRAST:  100mL OMNIPAQUE IOHEXOL 300 MG/ML  SOLN COMPARISON:  06/01/2018 FINDINGS: Lower chest: Cardiomegaly. Linear areas of scarring and/or atelectasis in the lung bases. No effusions. Hepatobiliary: Scattered calcifications compatible with old granulomatous disease. No focal hepatic stat no suspicious focal hepatic abnormality or biliary ductal dilatation. Gallbladder unremarkable. Pancreas: No focal abnormality or  ductal dilatation. Spleen: Calcifications throughout the spleen.  Normal size. Adrenals/Urinary Tract: Bilateral renal parapelvic cysts. No hydronephrosis. Adrenal glands unremarkable. Urinary bladder decompressed, grossly unremarkable. Stomach/Bowel: The appendix is dilated and contains appendicoliths. Diameter of the appendix 10 mm. There is surrounding inflammation. There also appears to be a recurrent fluid collection in the right lower quadrant in the area of prior drain. This measures up to 2.8 cm. Stomach and small bowel decompressed. Colon grossly unremarkable. No evidence of bowel obstruction. Vascular/Lymphatic: Aortic atherosclerosis. No enlarged abdominal or pelvic lymph nodes. Reproductive: Uterus and adnexa unremarkable.  No mass. Other: No free fluid or free air. Musculoskeletal: No acute bony abnormality. IMPRESSION: Dilated, inflamed appendix with appendicoliths again noted. Findings again compatible with appendicitis and is similar to prior study. There is recurrent fluid collection in the right lower quadrant superior to the appendix which has previously been drained. This measures 2.8 cm. 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 Aortic atherosclerosis.  Electronically Signed   By: Rolm Baptise M.D.   On: 10/20/2018 23:35    Procedures Procedures (including critical care time)  Medications Ordered in ED Medications  sodium chloride (PF) 0.9 % injection (has no administration in time range)  sodium chloride 0.9 % bolus 1,000 mL (has no administration in time range)  sodium chloride flush (NS) 0.9 % injection 3 mL (3 mLs Intravenous Given 10/20/18 2344)  HYDROmorphone (DILAUDID) injection 0.5 mg (0.5 mg Intravenous Given 10/20/18 2239)  ondansetron (ZOFRAN) injection 4 mg (4 mg Intravenous Given 10/20/18 2238)  sodium chloride 0.9 % bolus 500 mL (0 mLs Intravenous Stopped 10/20/18 2343)  iohexol (OMNIPAQUE) 300 MG/ML solution 100 mL (100 mLs Intravenous Contrast Given 10/20/18 2254)  piperacillin-tazobactam (ZOSYN) IVPB 3.375 g (3.375 g Intravenous New Bag/Given 10/20/18 2343)     Initial Impression / Assessment and Plan / ED Course  I have reviewed the triage vital signs and the nursing notes.  Pertinent labs & imaging results that were available during my care of the patient were reviewed by me and considered in my medical decision making (see chart for details).        Patient seen and examined.  Work-up reviewed.  Patient with elevated white blood cell count of 22k.  CT abdomen and pelvis ordered.  Pain medication ordered.  Patient guards with movement.  Concern for recurrent appendicitis.  Vital signs reviewed and are as follows: BP (!) 159/106    Pulse (!) 125    Temp 98.7 F (37.1 C) (Oral)    Resp 19    Ht 5\' 5"  (1.651 m)    Wt 74.4 kg    SpO2 98%    BMI 27.29 kg/m   CT imaging reviewed by myself.  Consistent with recurrent appendicitis.  There is a fluid collection anteriorly.  Patient discussed with and seen by Dr. Darl Householder.  Dr. Dayna Barker has also seen the patient and has spoken with general surgery who will see in the emergency department.  Zosyn has been ordered.  Blood cultures are pending.  COVID-19 screening ordered.  Final  Clinical Impressions(s) / ED Diagnoses   Final diagnoses:  Acute appendicitis with perforation and peritoneal abscess   Patient with recurrent appendicitis and fluid collection.  Awaiting surgery evaluation.  Elevated heart rate noted with out lactic acidosis.  Patient receiving antibiotics.  ED Discharge Orders    None       Carlisle Cater, Hershal Coria 10/21/18 0036    Mesner, [name], MD 10/21/18 [phone]

## 2018-10-20 NOTE — ED Notes (Signed)
Patient transported to CT 

## 2018-10-20 NOTE — ED Triage Notes (Signed)
Patient c/o RLQ pain since this AM.Patient states she had appendicitis in April and was told she could not have surgery due to Covid and told her that they were going to take her appendix later on. Patient states she had a drain x 3 weeks.

## 2018-10-21 ENCOUNTER — Encounter (HOSPITAL_COMMUNITY): Payer: Self-pay | Admitting: Anesthesiology

## 2018-10-21 ENCOUNTER — Encounter (HOSPITAL_COMMUNITY): Admission: EM | Disposition: A | Discharge status: Home / Self Care | Payer: Self-pay | Source: Home / Self Care

## 2018-10-21 ENCOUNTER — Inpatient Hospital Stay (HOSPITAL_COMMUNITY): Payer: Medicare Other | Admitting: Anesthesiology

## 2018-10-21 DIAGNOSIS — K3580 Unspecified acute appendicitis: Secondary | ICD-10-CM | POA: Diagnosis present

## 2018-10-21 DIAGNOSIS — I10 Essential (primary) hypertension: Secondary | ICD-10-CM | POA: Diagnosis present

## 2018-10-21 DIAGNOSIS — K3533 Acute appendicitis with perforation and localized peritonitis, with abscess: Secondary | ICD-10-CM | POA: Diagnosis present

## 2018-10-21 DIAGNOSIS — I4891 Unspecified atrial fibrillation: Secondary | ICD-10-CM | POA: Diagnosis present

## 2018-10-21 DIAGNOSIS — K3532 Acute appendicitis with perforation and localized peritonitis, without abscess: Secondary | ICD-10-CM | POA: Diagnosis not present

## 2018-10-21 DIAGNOSIS — I160 Hypertensive urgency: Secondary | ICD-10-CM | POA: Diagnosis not present

## 2018-10-21 DIAGNOSIS — K37 Unspecified appendicitis: Secondary | ICD-10-CM | POA: Diagnosis not present

## 2018-10-21 DIAGNOSIS — E871 Hypo-osmolality and hyponatremia: Secondary | ICD-10-CM | POA: Diagnosis not present

## 2018-10-21 DIAGNOSIS — E876 Hypokalemia: Secondary | ICD-10-CM | POA: Diagnosis not present

## 2018-10-21 DIAGNOSIS — I471 Supraventricular tachycardia: Secondary | ICD-10-CM | POA: Diagnosis not present

## 2018-10-21 DIAGNOSIS — R1031 Right lower quadrant pain: Secondary | ICD-10-CM | POA: Diagnosis not present

## 2018-10-21 DIAGNOSIS — Z20828 Contact with and (suspected) exposure to other viral communicable diseases: Secondary | ICD-10-CM | POA: Diagnosis present

## 2018-10-21 HISTORY — PX: LAPAROSCOPIC APPENDECTOMY: SHX408

## 2018-10-21 LAB — SURGICAL PCR SCREEN
MRSA, PCR: NEGATIVE
Staphylococcus aureus: NEGATIVE

## 2018-10-21 LAB — SARS CORONAVIRUS 2 BY RT PCR (HOSPITAL ORDER, PERFORMED IN ~~LOC~~ HOSPITAL LAB): SARS Coronavirus 2: NEGATIVE

## 2018-10-21 SURGERY — APPENDECTOMY, LAPAROSCOPIC
Anesthesia: General

## 2018-10-21 MED ORDER — EPHEDRINE SULFATE-NACL 50-0.9 MG/10ML-% IV SOSY
PREFILLED_SYRINGE | INTRAVENOUS | Status: DC | PRN
  Administered 2018-10-21: 10 mg via INTRAVENOUS

## 2018-10-21 MED ORDER — PROPOFOL 10 MG/ML IV BOLUS
INTRAVENOUS | Status: AC
  Filled 2018-10-21: qty 40

## 2018-10-21 MED ORDER — PROPOFOL 10 MG/ML IV BOLUS
INTRAVENOUS | Status: DC | PRN
  Administered 2018-10-21: 170 mg via INTRAVENOUS

## 2018-10-21 MED ORDER — DEXTROSE-NACL 5-0.9 % IV SOLN
INTRAVENOUS | Status: DC
  Administered 2018-10-21: 07:00:00 via INTRAVENOUS
  Filled 2018-10-21: qty 1000

## 2018-10-21 MED ORDER — ONDANSETRON HCL 4 MG/2ML IJ SOLN: 4.0000 mg | Freq: Once | INTRAMUSCULAR | Status: DC | PRN

## 2018-10-21 MED ORDER — LIDOCAINE 2% (20 MG/ML) 5 ML SYRINGE
INTRAMUSCULAR | Status: DC | PRN
  Administered 2018-10-21: 60 mg via INTRAVENOUS

## 2018-10-21 MED ORDER — PHENYLEPHRINE 40 MCG/ML (10ML) SYRINGE FOR IV PUSH (FOR BLOOD PRESSURE SUPPORT)
PREFILLED_SYRINGE | INTRAVENOUS | Status: DC | PRN
  Administered 2018-10-21: 120 ug via INTRAVENOUS
  Administered 2018-10-21: 80 ug via INTRAVENOUS
  Administered 2018-10-21: 160 ug via INTRAVENOUS
  Administered 2018-10-21: 120 ug via INTRAVENOUS

## 2018-10-21 MED ORDER — LABETALOL HCL 5 MG/ML IV SOLN
INTRAVENOUS | Status: AC
  Filled 2018-10-21: qty 4

## 2018-10-21 MED ORDER — OXYCODONE HCL 5 MG PO TABS
5.0000 mg | ORAL_TABLET | ORAL | Status: DC | PRN
  Administered 2018-10-22: 5 mg via ORAL
  Administered 2018-10-22: 10 mg via ORAL
  Administered 2018-10-23 – 2018-10-24 (×4): 5 mg via ORAL
  Filled 2018-10-21 (×6): qty 1

## 2018-10-21 MED ORDER — ACETAMINOPHEN 325 MG PO TABS: 650.0000 mg | ORAL_TABLET | Freq: Four times a day (QID) | ORAL | Status: DC | PRN

## 2018-10-21 MED ORDER — ROCURONIUM BROMIDE 10 MG/ML (PF) SYRINGE
PREFILLED_SYRINGE | INTRAVENOUS | Status: AC
  Filled 2018-10-21: qty 10

## 2018-10-21 MED ORDER — SODIUM CHLORIDE 0.9 % IV SOLN
INTRAVENOUS | Status: DC | PRN
  Administered 2018-10-21: 50 ug/min via INTRAVENOUS

## 2018-10-21 MED ORDER — ROCURONIUM BROMIDE 10 MG/ML (PF) SYRINGE
PREFILLED_SYRINGE | INTRAVENOUS | Status: DC | PRN
  Administered 2018-10-21: 50 mg via INTRAVENOUS

## 2018-10-21 MED ORDER — BUPIVACAINE-EPINEPHRINE 0.5% -1:200000 IJ SOLN
INTRAMUSCULAR | Status: AC
  Filled 2018-10-21: qty 1

## 2018-10-21 MED ORDER — DEXAMETHASONE SODIUM PHOSPHATE 10 MG/ML IJ SOLN
INTRAMUSCULAR | Status: DC | PRN
  Administered 2018-10-21: 8 mg via INTRAVENOUS

## 2018-10-21 MED ORDER — BUPIVACAINE-EPINEPHRINE 0.5% -1:200000 IJ SOLN
INTRAMUSCULAR | Status: DC | PRN
  Administered 2018-10-21: 10 mL

## 2018-10-21 MED ORDER — ESMOLOL HCL 100 MG/10ML IV SOLN
INTRAVENOUS | Status: AC
  Filled 2018-10-21: qty 10

## 2018-10-21 MED ORDER — FENTANYL CITRATE (PF) 100 MCG/2ML IJ SOLN
INTRAMUSCULAR | Status: AC
  Filled 2018-10-21: qty 2

## 2018-10-21 MED ORDER — PIPERACILLIN-TAZOBACTAM 3.375 G IVPB
3.3750 g | Freq: Three times a day (TID) | INTRAVENOUS | Status: DC
  Administered 2018-10-21 – 2018-10-24 (×10): 3.375 g via INTRAVENOUS
  Filled 2018-10-21 (×11): qty 50

## 2018-10-21 MED ORDER — ESMOLOL HCL 100 MG/10ML IV SOLN
INTRAVENOUS | Status: DC | PRN
  Administered 2018-10-21: 30 mg via INTRAVENOUS

## 2018-10-21 MED ORDER — HYDROMORPHONE HCL 1 MG/ML IJ SOLN
1.0000 mg | INTRAMUSCULAR | Status: DC | PRN
  Administered 2018-10-21: 1 mg via INTRAVENOUS
  Filled 2018-10-21: qty 1

## 2018-10-21 MED ORDER — FENTANYL CITRATE (PF) 250 MCG/5ML IJ SOLN
INTRAMUSCULAR | Status: AC
  Filled 2018-10-21: qty 5

## 2018-10-21 MED ORDER — FENTANYL CITRATE (PF) 100 MCG/2ML IJ SOLN
25.0000 ug | INTRAMUSCULAR | Status: DC | PRN
  Administered 2018-10-21 (×2): 50 ug via INTRAVENOUS

## 2018-10-21 MED ORDER — OXYCODONE HCL 5 MG PO TABS: 5.0000 mg | ORAL_TABLET | Freq: Once | ORAL | Status: DC | PRN

## 2018-10-21 MED ORDER — LACTATED RINGERS IR SOLN
Status: DC | PRN
  Administered 2018-10-21: 1

## 2018-10-21 MED ORDER — SUGAMMADEX SODIUM 200 MG/2ML IV SOLN
INTRAVENOUS | Status: DC | PRN
  Administered 2018-10-21: 200 mg via INTRAVENOUS

## 2018-10-21 MED ORDER — ONDANSETRON 4 MG PO TBDP: 4.0000 mg | ORAL_TABLET | Freq: Four times a day (QID) | ORAL | Status: DC | PRN

## 2018-10-21 MED ORDER — LACTATED RINGERS IV SOLN
INTRAVENOUS | Status: DC
  Administered 2018-10-21 (×2): via INTRAVENOUS

## 2018-10-21 MED ORDER — LACTATED RINGERS IV SOLN
INTRAVENOUS | Status: DC
  Administered 2018-10-21: 18:00:00 via INTRAVENOUS

## 2018-10-21 MED ORDER — PHENYLEPHRINE 40 MCG/ML (10ML) SYRINGE FOR IV PUSH (FOR BLOOD PRESSURE SUPPORT)
PREFILLED_SYRINGE | INTRAVENOUS | Status: AC
  Filled 2018-10-21: qty 10

## 2018-10-21 MED ORDER — ONDANSETRON HCL 4 MG/2ML IJ SOLN: 4.0000 mg | Freq: Four times a day (QID) | INTRAMUSCULAR | Status: DC | PRN

## 2018-10-21 MED ORDER — SUCCINYLCHOLINE CHLORIDE 20 MG/ML IJ SOLN
INTRAMUSCULAR | Status: DC | PRN
  Administered 2018-10-21: 120 mg via INTRAVENOUS

## 2018-10-21 MED ORDER — LABETALOL HCL 5 MG/ML IV SOLN
INTRAVENOUS | Status: DC | PRN
  Administered 2018-10-21 (×2): 2.5 mg via INTRAVENOUS

## 2018-10-21 MED ORDER — 0.9 % SODIUM CHLORIDE (POUR BTL) OPTIME
TOPICAL | Status: DC | PRN
  Administered 2018-10-21: 15:00:00 1000 mL

## 2018-10-21 MED ORDER — ONDANSETRON HCL 4 MG/2ML IJ SOLN
INTRAMUSCULAR | Status: AC
  Filled 2018-10-21: qty 2

## 2018-10-21 MED ORDER — OXYCODONE HCL 5 MG/5ML PO SOLN: 5.0000 mg | Freq: Once | ORAL | Status: DC | PRN

## 2018-10-21 MED ORDER — PIPERACILLIN-TAZOBACTAM 3.375 G IVPB 30 MIN: 3.3750 g | Freq: Once | INTRAVENOUS | Status: DC

## 2018-10-21 MED ORDER — ZOLPIDEM TARTRATE 5 MG PO TABS: 5.0000 mg | ORAL_TABLET | Freq: Every evening | ORAL | Status: DC | PRN

## 2018-10-21 MED ORDER — DEXAMETHASONE SODIUM PHOSPHATE 10 MG/ML IJ SOLN
INTRAMUSCULAR | Status: AC
  Filled 2018-10-21: qty 1

## 2018-10-21 MED ORDER — ONDANSETRON HCL 4 MG/2ML IJ SOLN
INTRAMUSCULAR | Status: DC | PRN
  Administered 2018-10-21: 4 mg via INTRAVENOUS

## 2018-10-21 MED ORDER — SUCCINYLCHOLINE CHLORIDE 200 MG/10ML IV SOSY
PREFILLED_SYRINGE | INTRAVENOUS | Status: AC
  Filled 2018-10-21: qty 10

## 2018-10-21 MED ORDER — LIDOCAINE 2% (20 MG/ML) 5 ML SYRINGE
INTRAMUSCULAR | Status: AC
  Filled 2018-10-21: qty 5

## 2018-10-21 MED ORDER — FENTANYL CITRATE (PF) 100 MCG/2ML IJ SOLN
INTRAMUSCULAR | Status: DC | PRN
  Administered 2018-10-21: 50 ug via INTRAVENOUS
  Administered 2018-10-21: 100 ug via INTRAVENOUS
  Administered 2018-10-21 (×2): 50 ug via INTRAVENOUS

## 2018-10-21 MED ORDER — SODIUM CHLORIDE 0.9 % IV BOLUS
1000.0000 mL | Freq: Once | INTRAVENOUS | Status: AC
  Administered 2018-10-21: 1000 mL via INTRAVENOUS

## 2018-10-21 MED ORDER — METOPROLOL TARTRATE 5 MG/5ML IV SOLN
5.0000 mg | Freq: Four times a day (QID) | INTRAVENOUS | Status: DC | PRN
  Administered 2018-10-22: 5 mg via INTRAVENOUS
  Filled 2018-10-21: qty 5

## 2018-10-21 SURGICAL SUPPLY — 37 items
APPLIER CLIP ROT 10 11.4 M/L (STAPLE)
CABLE HIGH FREQUENCY MONO STRZ (ELECTRODE) ×3 IMPLANT
CHLORAPREP W/TINT 26 (MISCELLANEOUS) ×3 IMPLANT
CLIP APPLIE ROT 10 11.4 M/L (STAPLE) IMPLANT
COVER WAND RF STERILE (DRAPES) IMPLANT
CUTTER FLEX LINEAR 45M (STAPLE) ×2 IMPLANT
DECANTER SPIKE VIAL GLASS SM (MISCELLANEOUS) ×3 IMPLANT
DERMABOND ADVANCED (GAUZE/BANDAGES/DRESSINGS) ×2
DERMABOND ADVANCED .7 DNX12 (GAUZE/BANDAGES/DRESSINGS) ×1 IMPLANT
DRSG TEGADERM 6X8 (GAUZE/BANDAGES/DRESSINGS) ×2 IMPLANT
ELECT REM PT RETURN 15FT ADLT (MISCELLANEOUS) ×3 IMPLANT
ENDOLOOP SUT PDS II  0 18 (SUTURE)
ENDOLOOP SUT PDS II 0 18 (SUTURE) IMPLANT
GAUZE SPONGE 2X2 8PLY STRL LF (GAUZE/BANDAGES/DRESSINGS) IMPLANT
GLOVE BIO SURGEON STRL SZ7.5 (GLOVE) ×3 IMPLANT
GOWN STRL REUS W/TWL XL LVL3 (GOWN DISPOSABLE) ×6 IMPLANT
KIT BASIN OR (CUSTOM PROCEDURE TRAY) ×3 IMPLANT
KIT TURNOVER KIT A (KITS) IMPLANT
POUCH SPECIMEN RETRIEVAL 10MM (ENDOMECHANICALS) ×3 IMPLANT
RELOAD 45 THICK GREEN (ENDOMECHANICALS) ×3 IMPLANT
RELOAD 45 VASCULAR/THIN (ENDOMECHANICALS) IMPLANT
RELOAD STAPLE 45 2.5 WHT GRN (ENDOMECHANICALS) IMPLANT
RELOAD STAPLE 45 3.5 BLU ETS (ENDOMECHANICALS) IMPLANT
RELOAD STAPLE 45 GRN THCK ETS (ENDOMECHANICALS) IMPLANT
RELOAD STAPLE TA45 3.5 REG BLU (ENDOMECHANICALS) IMPLANT
SCISSORS LAP 5X35 DISP (ENDOMECHANICALS) ×3 IMPLANT
SET IRRIG TUBING LAPAROSCOPIC (IRRIGATION / IRRIGATOR) ×3 IMPLANT
SET TUBE SMOKE EVAC HIGH FLOW (TUBING) ×3 IMPLANT
SHEARS HARMONIC ACE PLUS 36CM (ENDOMECHANICALS) ×3 IMPLANT
SPONGE GAUZE 2X2 STER 10/PKG (GAUZE/BANDAGES/DRESSINGS) ×2
SUT ETHILON 3 0 PS 1 (SUTURE) ×2 IMPLANT
SUT MNCRL AB 4-0 PS2 18 (SUTURE) ×3 IMPLANT
TOWEL OR 17X26 10 PK STRL BLUE (TOWEL DISPOSABLE) ×3 IMPLANT
TRAY FOLEY MTR SLVR 16FR STAT (SET/KITS/TRAYS/PACK) IMPLANT
TRAY LAPAROSCOPIC (CUSTOM PROCEDURE TRAY) ×3 IMPLANT
TROCAR BLADELESS OPT 5 100 (ENDOMECHANICALS) ×3 IMPLANT
TROCAR XCEL BLUNT TIP 100MML (ENDOMECHANICALS) ×3 IMPLANT

## 2018-10-21 NOTE — ED Notes (Signed)
Pt ambulated to RR without assistance. Pt had steady gait and no reports of dizziness.

## 2018-10-21 NOTE — ED Provider Notes (Signed)
Medical screening examination/treatment/procedure(s) were conducted as a shared visit with non-physician practitioner(s) and myself.  I personally evaluated the patient during the encounter.  Patient had appendicitis back in March with a localized abscess that was drained by IR.  She is on antibiotics she did well and then she is here with recurrence of symptoms.  On my evaluation dysuria pain medicine antibiotics and her pain is improved.  Review of her labs and CT she does have leukocytosis and recurrence of appendicitis with a localized abscess.  I discussed with Dr. Brantley Stage with surgery who will see for admission.        Monique Castillo, [name], MD 10/21/18 [phone]

## 2018-10-21 NOTE — H&P (View-Only) (Signed)
Subjective/Chief Complaint: Complains of RLQ pain   Objective: Vital signs in last 24 hours: Temp:  [98.7 F (37.1 C)-99.1 F (37.3 C)] 99.1 F (37.3 C) (08/28 0810) Pulse Rate:  [80-125] 119 (08/28 0810) Resp:  [14-20] 14 (08/28 0810) BP: (112-166)/(71-110) 147/101 (08/28 0810) SpO2:  [91 %-100 %] 100 % (08/28 0810) Weight:  [74.4 kg] 74.4 kg (08/27 1756)    Intake/Output from previous day: 08/27 0701 - 08/28 0700 In: 1543.1 [IV Piggyback:1543.1] Out: -  Intake/Output this shift: No intake/output data recorded.  General appearance: alert and cooperative Resp: clear to auscultation bilaterally Cardio: regular rate and rhythm GI: soft, moderate focal RLQ tenderness  Lab Results:  Recent Labs    10/20/18 1815  WBC 22.0*  HGB 13.6  HCT 42.4  PLT 245   BMET Recent Labs    10/20/18 1815  NA 139  K 3.7  CL 104  CO2 23  GLUCOSE 136*  BUN 17  CREATININE 0.95  CALCIUM 9.4   PT/INR No results for input(s): LABPROT, INR in the last 72 hours. ABG No results for input(s): PHART, HCO3 in the last 72 hours.  Invalid input(s): PCO2, PO2  Studies/Results: Ct Abdomen Pelvis W Contrast  Result Date: 10/20/2018 CLINICAL DATA:  Abdominal pain, right lower quadrant pain EXAM: CT ABDOMEN AND PELVIS WITH CONTRAST TECHNIQUE: Multidetector CT imaging of the abdomen and pelvis was performed using the standard protocol following bolus administration of intravenous contrast. CONTRAST:  135mL OMNIPAQUE IOHEXOL 300 MG/ML  SOLN COMPARISON:  06/01/2018 FINDINGS: Lower chest: Cardiomegaly. Linear areas of scarring and/or atelectasis in the lung bases. No effusions. Hepatobiliary: Scattered calcifications compatible with old granulomatous disease. No focal hepatic stat no suspicious focal hepatic abnormality or biliary ductal dilatation. Gallbladder unremarkable. Pancreas: No focal abnormality or ductal dilatation. Spleen: Calcifications throughout the spleen.  Normal size.  Adrenals/Urinary Tract: Bilateral renal parapelvic cysts. No hydronephrosis. Adrenal glands unremarkable. Urinary bladder decompressed, grossly unremarkable. Stomach/Bowel: The appendix is dilated and contains appendicoliths. Diameter of the appendix 10 mm. There is surrounding inflammation. There also appears to be a recurrent fluid collection in the right lower quadrant in the area of prior drain. This measures up to 2.8 cm. Stomach and small bowel decompressed. Colon grossly unremarkable. No evidence of bowel obstruction. Vascular/Lymphatic: Aortic atherosclerosis. No enlarged abdominal or pelvic lymph nodes. Reproductive: Uterus and adnexa unremarkable.  No mass. Other: No free fluid or free air. Musculoskeletal: No acute bony abnormality. IMPRESSION: Dilated, inflamed appendix with appendicoliths again noted. Findings again compatible with appendicitis and is similar to prior study. There is recurrent fluid collection in the right lower quadrant superior to the appendix which has previously been drained. This measures 2.8 cm. 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 Aortic atherosclerosis. Electronically Signed   By: Rolm Baptise M.D.   On: 10/20/2018 23:35    Anti-infectives: Anti-infectives (From admission, onward)   Start     Dose/Rate Route Frequency Ordered Stop   10/21/18 0345  piperacillin-tazobactam (ZOSYN) IVPB 3.375 g     3.375 g 12.5 mL/hr over 240 Minutes Intravenous Every 8 hours 10/21/18 0330     10/21/18 0015  piperacillin-tazobactam (ZOSYN) IVPB 3.375 g  Status:  Discontinued     3.375 g 100 mL/hr over 30 Minutes Intravenous  Once 10/21/18 0015 10/21/18 0015   10/20/18 2330  piperacillin-tazobactam (ZOSYN) IVPB 3.375 g     3.375 g 100 mL/hr over 30 Minutes Intravenous  Once 10/20/18 2317 10/21/18 0308  Assessment/Plan: s/p Procedure(s): APPENDECTOMY LAPAROSCOPIC (N/A) Plan for lap appy today.  Risks and benefits of the surgery discussed with pt including  possible open operation and she understands and wishes to proceed  LOS: 0 days    Olis Viverette Toth III 10/21/2018 

## 2018-10-21 NOTE — Transfer of Care (Signed)
Immediate Anesthesia Transfer of Care Note  Patient: Elijio Miles  Procedure(s) Performed: APPENDECTOMY LAPAROSCOPIC (N/A )  Patient Location: PACU  Anesthesia Type:General  Level of Consciousness: awake, oriented and patient cooperative  Airway & Oxygen Therapy: Patient Spontanous Breathing and Patient connected to face mask oxygen  Post-op Assessment: Report given to RN and Post -op Vital signs reviewed and stable  Post vital signs: Reviewed and stable  Last Vitals:  Vitals Value Taken Time  BP 146/78 10/21/18 1541  Temp    Pulse 84 10/21/18 1544  Resp 19 10/21/18 1544  SpO2 99 % 10/21/18 1544  Vitals shown include unvalidated device data.  Last Pain:  Vitals:   10/21/18 1300  TempSrc: Oral  PainSc:          Complications: No apparent anesthesia complications

## 2018-10-21 NOTE — Op Note (Signed)
10/20/2018 - 10/21/2018  3:23 PM  PATIENT:  Monique Castillo  73 y.o. female  PRE-OPERATIVE DIAGNOSIS:  APPENDICITIS  POST-OPERATIVE DIAGNOSIS:  APPENDICITIS  PROCEDURE:  Procedure(s): APPENDECTOMY LAPAROSCOPIC (N/A)  SURGEON:  Surgeon(s) and Role:    * Griselda Mineroth, Kiah Keay III, MD - Primary  PHYSICIAN ASSISTANT:   ASSISTANTS: Leary RocaMichael Maczis, PA   ANESTHESIA:   local and general  EBL:  30 mL   BLOOD ADMINISTERED:none  DRAINS: (1) Jackson-Pratt drain(s) with closed bulb suction in the right paracolic gutter   LOCAL MEDICATIONS USED:  MARCAINE     SPECIMEN:  Source of Specimen:  appendix  DISPOSITION OF SPECIMEN:  PATHOLOGY  COUNTS:  YES  TOURNIQUET:  * No tourniquets in log *  DICTATION: .Dragon Dictation   After informed consent was obtained patient was brought to the operating room placed in the supine position on the operating room table. After adequate induction of general anesthesia the patient's abdomen was prepped with ChloraPrep, allowed to dry, and draped in usual sterile manner. The area below the umbilicus was infiltrated with quarter percent Marcaine. A small incision was made with a 15 blade knife. This incision was carried down through the subcutaneous tissue bluntly with a hemostat and Army-Nav[employer] retractors until the linea alba was identified. The linea alba was incised with a 15 blade knife. Each side was grasped Coker clamps and elevated anteriorly. The preperitoneal space was probed bluntly with a hemostat until the peritoneum was opened and access was gained to the abdominal cavity. A 0 Vicryl purse string stitch was placed in the fascia surrounding the opening. A Hassan cannula was placed through the opening and anchored in place with the previously placed Vicryl purse string stitch. The laparoscope was [facility] the [facility]. The abdomen was insufflated with carbon dioxide without difficulty. Next the suprapubic area was infiltrated with quarter percent Marcaine. A  small incision was made with a 15 blade knife. A 5 mm port was placed bluntly through this incision into the abdominal cavity. A site was then chosen in the RUQ for placement of a 5 mm port. The area was infiltrated with quarter percent Marcaine. A small stab incision was made with a 15 blade knife. A 5 mm port was placed bluntly through this incision and the abdominal cavity under direct vision. The laparoscope was then moved to the suprapubic port. Using a Glassman grasper and harmonic scalpel the right lower quadrant was inspected.  The right lower quadrant was covered in pus like adhesions which were taken down bluntly with the suction tip catheter.  We were eventually able to identify the appendix and right colon and was also able to identify where the terminal ileum and ileocecal valve were located.  The appendix appeared anterior to this.  The appendix was elevated anteriorly and the mesoappendix was taken down sharply with the harmonic scalpel. Once the base of the appendix where it joined the cecum was identified and cleared of any tissue then a laparoscopic GIA green load 6 row stapler was placed through the [facility]. The stapler was placed across the base of the appendix clamped and fired thereby dividing the base of the appendix between staple lines. A laparoscopic bag was then inserted through the [facility]. The appendix was placed within the bag and the bag was sealed. The abdomen was then irrigated with copious amounts of saline until the effluent was clear. No other abnormalities were noted.  A 19 French round Blake drain was brought through the right  upper quadrant port site and placed in the right paracolic gutter and pelvis.  The drain was anchored to the skin with a 3-0 nylon stitch.  The appendix and bag were removed with the [facility] cannula through the infraumbilical port without difficulty. The fascial defect was closed with the previously placed Vicryl pursestring stitch as well as  with another interrupted 0 Vicryl figure-of-eight stitch. The rest of the ports were removed under direct vision and were found to be hemostatic. The gas was allowed to escape. The skin incisions were closed with interrupted 4-0 Monocryl subcuticular stitches. Dermabond dressings were applied. The patient tolerated the procedure well. At the end of the case all needle sponge and instrument counts were correct. The patient was then awakened and taken to recovery in stable condition.  PLAN OF CARE: Admit to inpatient   PATIENT DISPOSITION:  PACU - hemodynamically stable.   Delay start of Pharmacological VTE agent (>24hrs) due to surgical blood loss or risk of bleeding: no

## 2018-10-21 NOTE — Progress Notes (Signed)
Subjective/Chief Complaint: Complains of RLQ pain   Objective: Vital signs in last 24 hours: Temp:  [98.7 F (37.1 C)-99.1 F (37.3 C)] 99.1 F (37.3 C) (08/28 0810) Pulse Rate:  [80-125] 119 (08/28 0810) Resp:  [14-20] 14 (08/28 0810) BP: (112-166)/(71-110) 147/101 (08/28 0810) SpO2:  [91 %-100 %] 100 % (08/28 0810) Weight:  [74.4 kg] 74.4 kg (08/27 1756)    Intake/Output from previous day: 08/27 0701 - 08/28 0700 In: 1543.1 [IV Piggyback:1543.1] Out: -  Intake/Output this shift: No intake/output data recorded.  General appearance: alert and cooperative Resp: clear to auscultation bilaterally Cardio: regular rate and rhythm GI: soft, moderate focal RLQ tenderness  Lab Results:  Recent Labs    10/20/18 1815  WBC 22.0*  HGB 13.6  HCT 42.4  PLT 245   BMET Recent Labs    10/20/18 1815  NA 139  K 3.7  CL 104  CO2 23  GLUCOSE 136*  BUN 17  CREATININE 0.95  CALCIUM 9.4   PT/INR No results for input(s): LABPROT, INR in the last 72 hours. ABG No results for input(s): PHART, HCO3 in the last 72 hours.  Invalid input(s): PCO2, PO2  Studies/Results: Ct Abdomen Pelvis W Contrast  Result Date: 10/20/2018 CLINICAL DATA:  Abdominal pain, right lower quadrant pain EXAM: CT ABDOMEN AND PELVIS WITH CONTRAST TECHNIQUE: Multidetector CT imaging of the abdomen and pelvis was performed using the standard protocol following bolus administration of intravenous contrast. CONTRAST:  135mL OMNIPAQUE IOHEXOL 300 MG/ML  SOLN COMPARISON:  06/01/2018 FINDINGS: Lower chest: Cardiomegaly. Linear areas of scarring and/or atelectasis in the lung bases. No effusions. Hepatobiliary: Scattered calcifications compatible with old granulomatous disease. No focal hepatic stat no suspicious focal hepatic abnormality or biliary ductal dilatation. Gallbladder unremarkable. Pancreas: No focal abnormality or ductal dilatation. Spleen: Calcifications throughout the spleen.  Normal size.  Adrenals/Urinary Tract: Bilateral renal parapelvic cysts. No hydronephrosis. Adrenal glands unremarkable. Urinary bladder decompressed, grossly unremarkable. Stomach/Bowel: The appendix is dilated and contains appendicoliths. Diameter of the appendix 10 mm. There is surrounding inflammation. There also appears to be a recurrent fluid collection in the right lower quadrant in the area of prior drain. This measures up to 2.8 cm. Stomach and small bowel decompressed. Colon grossly unremarkable. No evidence of bowel obstruction. Vascular/Lymphatic: Aortic atherosclerosis. No enlarged abdominal or pelvic lymph nodes. Reproductive: Uterus and adnexa unremarkable.  No mass. Other: No free fluid or free air. Musculoskeletal: No acute bony abnormality. IMPRESSION: Dilated, inflamed appendix with appendicoliths again noted. Findings again compatible with appendicitis and is similar to prior study. There is recurrent fluid collection in the right lower quadrant superior to the appendix which has previously been drained. This measures 2.8 cm. 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 Aortic atherosclerosis. Electronically Signed   By: Rolm Baptise M.D.   On: 10/20/2018 23:35    Anti-infectives: Anti-infectives (From admission, onward)   Start     Dose/Rate Route Frequency Ordered Stop   10/21/18 0345  piperacillin-tazobactam (ZOSYN) IVPB 3.375 g     3.375 g 12.5 mL/hr over 240 Minutes Intravenous Every 8 hours 10/21/18 0330     10/21/18 0015  piperacillin-tazobactam (ZOSYN) IVPB 3.375 g  Status:  Discontinued     3.375 g 100 mL/hr over 30 Minutes Intravenous  Once 10/21/18 0015 10/21/18 0015   10/20/18 2330  piperacillin-tazobactam (ZOSYN) IVPB 3.375 g     3.375 g 100 mL/hr over 30 Minutes Intravenous  Once 10/20/18 2317 10/21/18 0308  Assessment/Plan: s/p Procedure(s): APPENDECTOMY LAPAROSCOPIC (N/A) Plan for lap appy today.  Risks and benefits of the surgery discussed with pt including  possible open operation and she understands and wishes to proceed  LOS: 0 days    Chevis Prettyaul Toth III 10/21/2018

## 2018-10-21 NOTE — Interval H&P Note (Signed)
History and Physical Interval Note:  10/21/2018 1:16 PM  Monique Castillo  has presented today for surgery, with the diagnosis of APPENDICITIS.  The various methods of treatment have been discussed with the patient and family. After consideration of risks, benefits and other options for treatment, the patient has consented to  Procedure(s): APPENDECTOMY LAPAROSCOPIC (N/A) as a surgical intervention.  The patient's history has been reviewed, patient examined, no change in status, stable for surgery.  I have reviewed the patient's chart and labs.  Questions were answered to the patient's satisfaction.     Autumn Messing III

## 2018-10-21 NOTE — Anesthesia Postprocedure Evaluation (Signed)
Anesthesia Post Note  Patient: Monique Castillo  Procedure(s) Performed: APPENDECTOMY LAPAROSCOPIC (N/A )     Patient location during evaluation: PACU Anesthesia Type: General Level of consciousness: awake and sedated Pain management: pain level controlled Vital Signs Assessment: post-procedure vital signs reviewed and stable Respiratory status: spontaneous breathing Cardiovascular status: stable Postop Assessment: no apparent nausea or vomiting Anesthetic complications: no    Last Vitals:  Vitals:   10/21/18 1600 10/21/18 1615  BP: 126/64 128/77  Pulse: 86 84  Resp: 14 15  Temp:    SpO2: 97% 93%    Last Pain:  Vitals:   10/21/18 1600  TempSrc:   PainSc: 6    Pain Goal:                   Huston Foley

## 2018-10-21 NOTE — Anesthesia Preprocedure Evaluation (Addendum)
Anesthesia Evaluation  Patient identified by MRN, date of birth, ID band Patient awake    Reviewed: Allergy & Precautions, NPO status , Patient's Chart, lab work & pertinent test results  History of Anesthesia Complications Negative for: history of anesthetic complications  Airway Mallampati: I       Dental no notable dental hx. (+) Teeth Intact   Pulmonary neg pulmonary ROS,    Pulmonary exam normal breath sounds clear to auscultation       Cardiovascular Normal cardiovascular exam+ dysrhythmias Atrial Fibrillation  Rhythm:Regular Rate:Normal   '20 TTE - EF 60-65%. There is mildly increased left ventricular wall thickness. Left atrial size was mildly dilated. Mild sclerosis of the aortic valve.    Neuro/Psych negative neurological ROS  negative psych ROS   GI/Hepatic Neg liver ROS,  Appendicitis    Endo/Other  negative endocrine ROS  Renal/GU      Musculoskeletal negative musculoskeletal ROS (+)   Abdominal Normal abdominal exam  (+)   Peds  Hematology negative hematology ROS (+)   Anesthesia Other Findings   Reproductive/Obstetrics                            Anesthesia Physical Anesthesia Plan  ASA: II  Anesthesia Plan: General   Post-op Pain Management:    Induction: Intravenous and Rapid sequence  PONV Risk Score and Plan: 4 or greater and Treatment may vary due to age or medical condition, Ondansetron and Dexamethasone  Airway Management Planned: Oral ETT  Additional Equipment: None  Intra-op Plan:   Post-operative Plan: Extubation in OR  Informed Consent: I have reviewed the patients History and Physical, chart, labs and discussed the procedure including the risks, benefits and alternatives for the proposed anesthesia with the patient or authorized representative who has indicated his/her understanding and acceptance.     Dental advisory given  Plan Discussed  with: CRNA and Anesthesiologist  Anesthesia Plan Comments:        Anesthesia Quick Evaluation

## 2018-10-21 NOTE — Anesthesia Procedure Notes (Signed)
Procedure Name: Intubation Date/Time: 10/21/2018 2:10 PM Performed by: Eulas Post, Braheem Tomasik W, CRNA Pre-anesthesia Checklist: Patient identified, Emergency Drugs available, Suction available and Patient being monitored Patient Re-evaluated:Patient Re-evaluated prior to induction Oxygen Delivery Method: Circle system utilized Preoxygenation: Pre-oxygenation with 100% oxygen Induction Type: IV induction Ventilation: Mask ventilation without difficulty Laryngoscope Size: Miller and 2 Grade View: Grade I Tube type: Oral Tube size: 7.0 mm Number of attempts: 1 Airway Equipment and Method: Stylet and Oral airway Placement Confirmation: ETT inserted through vocal cords under direct vision,  positive ETCO2 and breath sounds checked- equal and bilateral Secured at: 22 cm Tube secured with: Tape Dental Injury: Teeth and Oropharynx as per pre-operative assessment

## 2018-10-21 NOTE — H&P (Signed)
Monique Castillo is an 73 y.o. female.   Chief Complaint: abdominal pain HPI: 1 day hx of RLQ abdominal pain constant made worse with movement location RLQ abdomen no nausea or vomiting  History of perforated appendix march 2020 treated with IR drain due to abscess.  Had a fib RVR but this resolved and she refused anticoagulation.  Drain removed in April  Not sure she follow up  Has appendicoliths on CT and recurrent abscess 2.8 cm  History reviewed. No pertinent past medical history.  Past Surgical History:  Procedure Laterality Date  . IR RADIOLOGIST EVAL & MGMT  06/01/2018  . PARTIAL HYSTERECTOMY      Family History  Problem Relation Age of Onset  . Seizures Father   . Ulcers Father   . Heart failure Father    Social History:  reports that she has never smoked. She has never used smokeless tobacco. She reports current alcohol use. She reports that she does not use drugs.  Allergies:  Allergies  Allergen Reactions  . Sulfa Antibiotics Other (See Comments)    Childhood allergy    (Not in a hospital admission)   Results for orders placed or performed during the hospital encounter of 10/20/18 (from the past 48 hour(s))  Lipase, blood     Status: None   Collection Time: 10/20/18  6:15 PM  Result Value Ref Range   Lipase 28 11 - 51 U/L    Comment: Performed at [facility], [address], [place], [place] [address]  Comprehensive metabolic panel     Status: Abnormal   Collection Time: 10/20/18  6:15 PM  Result Value Ref Range   Sodium 139 135 - 145 mmol/L   Potassium 3.7 3.5 - 5.1 mmol/L   Chloride 104 98 - 111 mmol/L   CO2 23 22 - 32 mmol/L   Glucose, Bld 136 (H) 70 - 99 mg/dL   BUN 17 8 - 23 mg/dL   Creatinine, Ser 4.28 0.44 - 1.00 mg/dL   Calcium 9.4 8.9 - 76.8 mg/dL   Total Protein 7.6 6.5 - 8.1 g/dL   Albumin 4.0 3.5 - 5.0 g/dL   AST 16 15 - 41 U/L   ALT 27 0 - 44 U/L   Alkaline Phosphatase 58 38 - 126 U/L   Total Bilirubin 1.3 (H) 0.3 - 1.2 mg/dL    GFR calc non Af Amer 60 (L) >60 mL/min   GFR calc Af Amer >60 >60 mL/min   Anion gap 12 5 - 15    Comment: Performed at [facility], [address], [place], [place] [address]  CBC     Status: Abnormal   Collection Time: 10/20/18  6:15 PM  Result Value Ref Range   WBC 22.0 (H) 4.0 - 10.5 K/uL   RBC 4.50 3.87 - 5.11 MIL/uL   Hemoglobin 13.6 12.0 - 15.0 g/dL   HCT 62.0 35.5 - 97.4 %   MCV 94.2 80.0 - 100.0 fL   MCH 30.2 26.0 - 34.0 pg   MCHC 32.1 30.0 - 36.0 g/dL   RDW 16.3 84.5 - 36.4 %   Platelets 245 150 - 400 K/uL   nRBC 0.0 0.0 - 0.2 %    Comment: Performed at [facility], [address], [place], [place] [address]  Lactic acid, plasma     Status: None   Collection Time: 10/20/18 10:04 PM  Result Value Ref Range   Lactic Acid, Venous 1.6 0.5 - 1.9 mmol/L  Comment: Performed at [facility], [place] [address], [place], [place] [address]  Urinalysis, Routine w reflex microscopic     Status: Abnormal   Collection Time: 10/20/18 10:43 PM  Result Value Ref Range   Color, Urine AMBER (A) YELLOW    Comment: BIOCHEMICALS MAY BE AFFECTED BY COLOR   APPearance HAZY (A) CLEAR   Specific Gravity, Urine 1.026 1.005 - 1.030   pH 5.0 5.0 - 8.0   Glucose, UA NEGATIVE NEGATIVE mg/dL   Hgb urine dipstick NEGATIVE NEGATIVE   Bilirubin Urine NEGATIVE NEGATIVE   Ketones, ur 5 (A) NEGATIVE mg/dL   Protein, ur 100 (A) NEGATIVE mg/dL   Nitrite NEGATIVE NEGATIVE   Leukocytes,Ua TRACE (A) NEGATIVE   RBC / HPF 0-5 0 - 5 RBC/hpf   WBC, UA 6-10 0 - 5 WBC/hpf   Bacteria, UA NONE SEEN NONE SEEN   Squamous Epithelial / LPF 0-5 0 - 5   Mucus PRESENT    Hyaline Casts, UA PRESENT     Comment: Performed at [facility], [place] [address], [place], [place] [address]   Ct Abdomen Pelvis W Contrast  Result Date: 10/20/2018 CLINICAL DATA:  Abdominal pain, right lower quadrant pain EXAM: CT ABDOMEN AND PELVIS WITH CONTRAST  TECHNIQUE: Multidetector CT imaging of the abdomen and pelvis was performed using the standard protocol following bolus administration of intravenous contrast. CONTRAST:  110mL OMNIPAQUE IOHEXOL 300 MG/ML  SOLN COMPARISON:  06/01/2018 FINDINGS: Lower chest: Cardiomegaly. Linear areas of scarring and/or atelectasis in the lung bases. No effusions. Hepatobiliary: Scattered calcifications compatible with old granulomatous disease. No focal hepatic stat no suspicious focal hepatic abnormality or biliary ductal dilatation. Gallbladder unremarkable. Pancreas: No focal abnormality or ductal dilatation. Spleen: Calcifications throughout the spleen.  Normal size. Adrenals/Urinary Tract: Bilateral renal parapelvic cysts. No hydronephrosis. Adrenal glands unremarkable. Urinary bladder decompressed, grossly unremarkable. Stomach/Bowel: The appendix is dilated and contains appendicoliths. Diameter of the appendix 10 mm. There is surrounding inflammation. There also appears to be a recurrent fluid collection in the right lower quadrant in the area of prior drain. This measures up to 2.8 cm. Stomach and small bowel decompressed. Colon grossly unremarkable. No evidence of bowel obstruction. Vascular/Lymphatic: Aortic atherosclerosis. No enlarged abdominal or pelvic lymph nodes. Reproductive: Uterus and adnexa unremarkable.  No mass. Other: No free fluid or free air. Musculoskeletal: No acute bony abnormality. IMPRESSION: Dilated, inflamed appendix with appendicoliths again noted. Findings again compatible with appendicitis and is similar to prior study. There is recurrent fluid collection in the right lower quadrant superior to the appendix which has previously been drained. This measures 2.8 cm. 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 Aortic atherosclerosis. Electronically Signed   By: Rolm Baptise M.D.   On: 10/20/2018 23:35    Review of Systems  All other systems reviewed and are negative.   Blood pressure (!)  159/106, pulse (!) 125, temperature 98.7 F (37.1 C), temperature source Oral, resp. rate 19, height 5\' 5"  (1.651 m), weight 74.4 kg, SpO2 98 %. Physical Exam  Constitutional: She is oriented to person, place, and time. She appears well-developed and well-nourished.  HENT:  Head: Normocephalic.  Eyes: Pupils are equal, round, and reactive to light. EOM are normal.  Neck: Normal range of motion. Neck supple.  Cardiovascular: Normal rate and regular rhythm.  Respiratory: Effort normal and breath sounds normal.  GI: There is abdominal tenderness in the right lower quadrant. There is tenderness at McBurney's point. There is no rigidity and  no guarding.  Musculoskeletal: Normal range of motion.  Neurological: She is alert and oriented to person, place, and time.  Skin: Skin is warm and dry.     Assessment/Plan Recurrent appendicitis with abscess Admit for IVF and start zosyn 3.375 gm IV Q 6 Will need appendectomy but has abscess and may need drain first and interval appendectomy vs appendectomy during admission Dr Carolynne Edouardoth to see in am  Clears until 3 am then NPO   Dortha Schwalbehomas A Baya Lentz, MD 10/21/2018, 1:13 AM

## 2018-10-21 NOTE — ED Notes (Signed)
ED TO INPATIENT HANDOFF REPORT  ED Nurse Name and Phone #:  Maclaine Ahola 1889  S Name/Age/Gender Monique HeritageJane Macrae 73 y.o. female Room/Bed: WA22/WA22  Code Status   Code Status: Full Code  Home/SNF/Other Home Patient oriented to: self Is this baseline? Yes      Chief Complaint Abd Pain  Triage Note Patient c/o RLQ pain since this AM.Patient states she had appendicitis in April and was told she could not have surgery due to Covid and told her that they were going to take her appendix later on. Patient states she had a drain x 3 weeks.     Allergies Allergies  Allergen Reactions  . Sulfa Antibiotics Other (See Comments)    Childhood allergy    Level of Care/Admitting Diagnosis ED Disposition    ED Disposition Condition Comment   Admit  [facility] [[id number]]  Level of Care: Med-Surg [16]  Covid Evaluation: Asymptomatic Screening Protocol (No Symptoms)  Diagnosis: Acute appendicitis [161096][[id number]]  Admitting Physician: CCS, MD [[id number]]  Attending Physician: CCS, MD [[id number]]  Estimated length of stay: 3 - 4 days  Certification:: I certify this patient will need inpatient services for at least 2 midnights  PT Class (Do Not Modify): Inpatient [101]  PT Acc Code (Do Not Modify): Private [1]       B Medical/Surgery History History reviewed. No pertinent past medical history. Past Surgical History:  Procedure Laterality Date  . IR RADIOLOGIST EVAL & MGMT  06/01/2018  . PARTIAL HYSTERECTOMY       A IV Location/Drains/Wounds Patient Lines/Drains/Airways Status   Active Line/Drains/Airways    Name:   Placement date:   Placement time:   Site:   Days:   Peripheral IV 10/20/18 Left Antecubital   10/20/18    2233    Antecubital   1   Peripheral IV 10/21/18 Right Forearm   10/21/18    0427    Forearm   less than 1          Intake/Output Last 24 hours  Intake/Output Summary (Last 24 hours) at 10/21/2018 [id number] Last data filed at 10/21/2018 [id number] Gross per  24 hour  Intake 1543.1 ml  Output -  Net 1543.1 ml    Labs/Imaging Results for orders placed or performed during the hospital encounter of 10/20/18 (from the past 48 hour(s))  Lipase, blood     Status: None   Collection Time: 10/20/18  6:15 PM  Result Value Ref Range   Lipase 28 11 - 51 U/L    Comment: Performed at [facility], [address] [place], [place] [address]  Comprehensive metabolic panel     Status: Abnormal   Collection Time: 10/20/18  6:15 PM  Result Value Ref Range   Sodium 139 135 - 145 mmol/L   Potassium 3.7 3.5 - 5.1 mmol/L   Chloride 104 98 - 111 mmol/L   CO2 23 22 - 32 mmol/L   Glucose, Bld 136 (H) 70 - 99 mg/dL   BUN 17 8 - 23 mg/dL   Creatinine, Ser 8.290.95 0.44 - 1.00 mg/dL   Calcium 9.4 8.9 - 56.210.3 mg/dL   Total Protein 7.6 6.5 - 8.1 g/dL   Albumin 4.0 3.5 - 5.0 g/dL   AST 16 15 - 41 U/L   ALT 27 0 - 44 U/L   Alkaline Phosphatase 58 38 - 126 U/L   Total Bilirubin 1.3 (H) 0.3 - 1.2 mg/dL   GFR calc non Af Amer 60 (L) >  60 mL/min   GFR calc Af Amer >60 >60 mL/min   Anion gap 12 5 - 15    Comment: Performed at [facility], [address] [place], [place] [address]  CBC     Status: Abnormal   Collection Time: 10/20/18  6:15 PM  Result Value Ref Range   WBC 22.0 (H) 4.0 - 10.5 K/uL   RBC 4.50 3.87 - 5.11 MIL/uL   Hemoglobin 13.6 12.0 - 15.0 g/dL   HCT 60.442.4 54.036.0 - 98.146.0 %   MCV 94.2 80.0 - 100.0 fL   MCH 30.2 26.0 - 34.0 pg   MCHC 32.1 30.0 - 36.0 g/dL   RDW 19.112.8 47.811.5 - 29.515.5 %   Platelets 245 150 - 400 K/uL   nRBC 0.0 0.0 - 0.2 %    Comment: Performed at [facility], [address] [place], [place] [address]  Lactic acid, plasma     Status: None   Collection Time: 10/20/18 10:04 PM  Result Value Ref Range   Lactic Acid, Venous 1.6 0.5 - 1.9 mmol/L    Comment: Performed at [facility], [address] [place], [place] [address]  Urinalysis, Routine w reflex microscopic      Status: Abnormal   Collection Time: 10/20/18 10:43 PM  Result Value Ref Range   Color, Urine AMBER (A) YELLOW    Comment: BIOCHEMICALS MAY BE AFFECTED BY COLOR   APPearance HAZY (A) CLEAR   Specific Gravity, Urine 1.026 1.005 - 1.030   pH 5.0 5.0 - 8.0   Glucose, UA NEGATIVE NEGATIVE mg/dL   Hgb urine dipstick NEGATIVE NEGATIVE   Bilirubin Urine NEGATIVE NEGATIVE   Ketones, ur 5 (A) NEGATIVE mg/dL   Protein, ur 469100 (A) NEGATIVE mg/dL   Nitrite NEGATIVE NEGATIVE   Leukocytes,Ua TRACE (A) NEGATIVE   RBC / HPF 0-5 0 - 5 RBC/hpf   WBC, UA 6-10 0 - 5 WBC/hpf   Bacteria, UA NONE SEEN NONE SEEN   Squamous Epithelial / LPF 0-5 0 - 5   Mucus PRESENT    Hyaline Casts, UA PRESENT     Comment: Performed at [facility], [address] [place], [place] [address]  SARS Coronavirus 2 [facility](Hospital order, Performed in [facility] hospital lab) Nasopharyngeal Nasopharyngeal Swab     Status: None   Collection Time: 10/21/18 12:45 AM   Specimen: Nasopharyngeal Swab  Result Value Ref Range   SARS Coronavirus 2 NEGATIVE NEGATIVE    Comment: (NOTE) If result is NEGATIVE SARS-CoV-2 target nucleic acids are NOT DETECTED. The SARS-CoV-2 RNA is generally detectable in upper and lower  respiratory specimens during the acute phase of infection. The lowest  concentration of SARS-CoV-2 viral copies this assay can detect is 250  copies / mL. A negative result does not preclude SARS-CoV-2 infection  and should not be used as the sole basis for treatment or other  patient management decisions.  A negative result may occur with  improper specimen collection / handling, submission of specimen other  than nasopharyngeal swab, presence of viral mutation(s) within the  areas targeted by this assay, and inadequate number of viral copies  (<250 copies / mL). A negative result must be combined with clinical  observations, patient history, and epidemiological information. If result is  POSITIVE SARS-CoV-2 target nucleic acids are DETECTED. The SARS-CoV-2 RNA is generally detectable in upper and lower  respiratory specimens dur ing the acute phase of infection.  Positive  results are  indicative of active infection with SARS-CoV-2.  Clinical  correlation with patient history and other diagnostic information is  necessary to determine patient infection status.  Positive results do  not rule out bacterial infection or co-infection with other viruses. If result is PRESUMPTIVE POSTIVE SARS-CoV-2 nucleic acids MAY BE PRESENT.   A presumptive positive result was obtained on the submitted specimen  and confirmed on repeat testing.  While 2019 novel coronavirus  (SARS-CoV-2) nucleic acids may be present in the submitted sample  additional confirmatory testing may be necessary for epidemiological  and / or clinical management purposes  to differentiate between  SARS-CoV-2 and other Sarbecovirus currently known to infect humans.  If clinically indicated additional testing with an alternate test  methodology [phone]) is advised. The SARS-CoV-2 RNA is generally  detectable in upper and lower respiratory sp ecimens during the acute  phase of infection. The expected result is Negative. Fact Sheet for Patients:  StrictlyIdeas.no Fact Sheet for Healthcare Providers: BankingDealers.co.za This test is not yet approved or cleared by the [place] FDA and has been authorized for detection and/or diagnosis of SARS-CoV-2 by FDA under an Emergency Use Authorization (EUA).  This EUA will remain in effect (meaning this test can be used) for the duration of the COVID-19 declaration under Section 564(b)(1) of the Act, 21 U.S.C. section 360bbb-3(b)(1), unless the authorization is terminated or revoked sooner. Performed at [facility], [place] [address], [place], [place] [address]    Ct Abdomen Pelvis W Contrast  Result  Date: 10/20/2018 CLINICAL DATA:  Abdominal pain, right lower quadrant pain EXAM: CT ABDOMEN AND PELVIS WITH CONTRAST TECHNIQUE: Multidetector CT imaging of the abdomen and pelvis was performed using the standard protocol following bolus administration of intravenous contrast. CONTRAST:  166mL OMNIPAQUE IOHEXOL 300 MG/ML  SOLN COMPARISON:  06/01/2018 FINDINGS: Lower chest: Cardiomegaly. Linear areas of scarring and/or atelectasis in the lung bases. No effusions. Hepatobiliary: Scattered calcifications compatible with old granulomatous disease. No focal hepatic stat no suspicious focal hepatic abnormality or biliary ductal dilatation. Gallbladder unremarkable. Pancreas: No focal abnormality or ductal dilatation. Spleen: Calcifications throughout the spleen.  Normal size. Adrenals/Urinary Tract: Bilateral renal parapelvic cysts. No hydronephrosis. Adrenal glands unremarkable. Urinary bladder decompressed, grossly unremarkable. Stomach/Bowel: The appendix is dilated and contains appendicoliths. Diameter of the appendix 10 mm. There is surrounding inflammation. There also appears to be a recurrent fluid collection in the right lower quadrant in the area of prior drain. This measures up to 2.8 cm. Stomach and small bowel decompressed. Colon grossly unremarkable. No evidence of bowel obstruction. Vascular/Lymphatic: Aortic atherosclerosis. No enlarged abdominal or pelvic lymph nodes. Reproductive: Uterus and adnexa unremarkable.  No mass. Other: No free fluid or free air. Musculoskeletal: No acute bony abnormality. IMPRESSION: Dilated, inflamed appendix with appendicoliths again noted. Findings again compatible with appendicitis and is similar to prior study. There is recurrent fluid collection in the right lower quadrant superior to the appendix which has previously been drained. This measures 2.8 cm. 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 Aortic atherosclerosis. Electronically Signed   By: Rolm Baptise M.D.    On: 10/20/2018 23:35    Pending Labs Unresulted Labs (From admission, onward)    Start     Ordered   10/20/18 2317  Blood culture (routine x 2)  BLOOD CULTURE X 2,   STAT     10/20/18 2317          Vitals/Pain Today's Vitals   10/21/18 0530 10/21/18 0600 10/21/18 0637 10/21/18  0700  BP: 127/81 112/71 129/79 125/77  Pulse: 82 81 82 86  Resp: 15 14 20 17   Temp:      TempSrc:      SpO2: 93% 93% 95% 91%  Weight:      Height:      PainSc:   3      Isolation Precautions No active isolations  Medications Medications  dextrose 5 %-0.9 % sodium chloride infusion ( Intravenous New Bag/Given 10/21/18 0638)  piperacillin-tazobactam (ZOSYN) IVPB 3.375 g (3.375 g Intravenous New Bag/Given 10/21/18 0419)  HYDROmorphone (DILAUDID) injection 1 mg (has no administration in time range)  ondansetron (ZOFRAN-ODT) disintegrating tablet 4 mg (has no administration in time range)    Or  ondansetron (ZOFRAN) injection 4 mg (has no administration in time range)  zolpidem (AMBIEN) tablet 5 mg (has no administration in time range)  metoprolol tartrate (LOPRESSOR) injection 5 mg (has no administration in time range)  sodium chloride flush (NS) 0.9 % injection 3 mL (3 mLs Intravenous Given 10/20/18 2344)  HYDROmorphone (DILAUDID) injection 0.5 mg (0.5 mg Intravenous Given 10/20/18 2239)  ondansetron (ZOFRAN) injection 4 mg (4 mg Intravenous Given 10/20/18 2238)  sodium chloride 0.9 % bolus 500 mL (0 mLs Intravenous Stopped 10/20/18 2343)  sodium chloride (PF) 0.9 % injection (  Given by Other 10/20/18 2254)  iohexol (OMNIPAQUE) 300 MG/ML solution 100 mL (100 mLs Intravenous Contrast Given 10/20/18 2254)  piperacillin-tazobactam (ZOSYN) IVPB 3.375 g (0 g Intravenous Stopped 10/21/18 0308)  sodium chloride 0.9 % bolus 1,000 mL (0 mLs Intravenous Stopped 10/21/18 0323)    Mobility walks Low fall risk   Focused Assessments SBAR   R Recommendations: See Admitting Provider Note  Report given to:  [facility]  Additional Notes:  none

## 2018-10-22 ENCOUNTER — Encounter (HOSPITAL_COMMUNITY): Payer: Self-pay | Admitting: General Surgery

## 2018-10-22 DIAGNOSIS — I160 Hypertensive urgency: Secondary | ICD-10-CM

## 2018-10-22 DIAGNOSIS — K3532 Acute appendicitis with perforation and localized peritonitis, without abscess: Secondary | ICD-10-CM

## 2018-10-22 DIAGNOSIS — I471 Supraventricular tachycardia: Secondary | ICD-10-CM | POA: Diagnosis present

## 2018-10-22 LAB — COMPREHENSIVE METABOLIC PANEL
ALT: 23 U/L (ref 0–44)
AST: 13 U/L — ABNORMAL LOW (ref 15–41)
Albumin: 3.2 g/dL — ABNORMAL LOW (ref 3.5–5.0)
Alkaline Phosphatase: 58 U/L (ref 38–126)
Anion gap: 12 (ref 5–15)
BUN: 15 mg/dL (ref 8–23)
CO2: 24 mmol/L (ref 22–32)
Calcium: 9.1 mg/dL (ref 8.9–10.3)
Chloride: 103 mmol/L (ref 98–111)
Creatinine, Ser: 0.98 mg/dL (ref 0.44–1.00)
GFR calc Af Amer: >60 mL/min (ref >60)
GFR calc non Af Amer: 58 mL/min — ABNORMAL LOW (ref >60)
Glucose, Bld: 129 mg/dL — ABNORMAL HIGH (ref 70–99)
Potassium: 3.6 mmol/L (ref 3.5–5.1)
Sodium: 139 mmol/L (ref 135–145)
Total Bilirubin: 0.4 mg/dL (ref 0.3–1.2)
Total Protein: 7.2 g/dL (ref 6.5–8.1)

## 2018-10-22 LAB — CBC
HCT: 34.1 % — ABNORMAL LOW (ref 36.0–46.0)
Hemoglobin: 10.9 g/dL — ABNORMAL LOW (ref 12.0–15.0)
MCH: 30.8 pg (ref 26.0–34.0)
MCHC: 32 g/dL (ref 30.0–36.0)
MCV: 96.3 fL (ref 80.0–100.0)
Platelets: 210 10*3/uL (ref 150–400)
RBC: 3.54 MIL/uL — ABNORMAL LOW (ref 3.87–5.11)
RDW: 13 % (ref 11.5–15.5)
WBC: 18.3 10*3/uL — ABNORMAL HIGH (ref 4.0–10.5)
nRBC: 0 % (ref 0.0–0.2)

## 2018-10-22 LAB — TROPONIN I (HIGH SENSITIVITY)
Troponin I (High Sensitivity): 7 ng/L (ref <18)
Troponin I (High Sensitivity): 9 ng/L (ref <18)

## 2018-10-22 LAB — PHOSPHORUS: Phosphorus: 2.4 mg/dL — ABNORMAL LOW (ref 2.5–4.6)

## 2018-10-22 LAB — MAGNESIUM: Magnesium: 2.2 mg/dL (ref 1.7–2.4)

## 2018-10-22 MED ORDER — DIPHENHYDRAMINE HCL 50 MG/ML IJ SOLN
12.5000 mg | Freq: Four times a day (QID) | INTRAMUSCULAR | Status: DC | PRN
  Administered 2018-10-22: 21:00:00 12.5 mg via INTRAVENOUS
  Filled 2018-10-22: qty 1

## 2018-10-22 MED ORDER — DILTIAZEM HCL 100 MG IV SOLR
5.0000 mg/h | INTRAVENOUS | Status: DC
  Administered 2018-10-22 – 2018-10-23 (×2): 5 mg/h via INTRAVENOUS
  Filled 2018-10-22: qty 100

## 2018-10-22 MED ORDER — CHLORHEXIDINE GLUCONATE CLOTH 2 % EX PADS
6.0000 | MEDICATED_PAD | Freq: Every day | CUTANEOUS | Status: DC
  Administered 2018-10-22 – 2018-10-23 (×2): 6 via TOPICAL

## 2018-10-22 MED ORDER — POTASSIUM CHLORIDE CRYS ER 20 MEQ PO TBCR
40.0000 meq | EXTENDED_RELEASE_TABLET | Freq: Once | ORAL | Status: AC
  Administered 2018-10-22: 40 meq via ORAL
  Filled 2018-10-22: qty 2

## 2018-10-22 MED ORDER — DILTIAZEM LOAD VIA INFUSION
20.0000 mg | Freq: Once | INTRAVENOUS | Status: AC
  Administered 2018-10-22: 20 mg via INTRAVENOUS
  Filled 2018-10-22: qty 20

## 2018-10-22 NOTE — Progress Notes (Signed)
Subjective No acute events. Feeling "Great." Asking when she can go home. Denies n/v. Tolerating clear liquids. Pain well controlled on oral analgesics and she reports the pain she has is incisional and not like the "appendix" pain she had before.  Objective: Vital signs in last 24 hours: Temp:  [97.6 F (36.4 C)-99.1 F (37.3 C)] 97.8 F (36.6 C) (08/29 0411) Pulse Rate:  [80-120] 90 (08/29 0411) Resp:  [14-20] 18 (08/29 0411) BP: (126-168)/(64-115) 153/98 (08/29 0411) SpO2:  [90 %-100 %] 96 % (08/29 0411)    Intake/Output from previous day: 08/28 0701 - 08/29 0700 In: 1310.3 [I.V.:1217; IV Piggyback:93.3] Out: 185 [Urine:75; Drains:80; Blood:30] Intake/Output this shift: Total I/O In: 1149.2 [P.O.:240; I.V.:802.5; IV Piggyback:106.7] Out: 10 [Drains:10]  Gen: NAD, comfortable CV: RRR Pulm: Normal work of breathing Abd: Soft, nontender, nondistended; incisions c/d/i without erythema. No rebound. No guarding. Ext: SCDs in place  Lab Results: CBC  Recent Labs    10/20/18 1815 10/22/18 0300  WBC 22.0* 18.3*  HGB 13.6 10.9*  HCT 42.4 34.1*  PLT 245 210   BMET Recent Labs    10/20/18 1815  NA 139  K 3.7  CL 104  CO2 23  GLUCOSE 136*  BUN 17  CREATININE 0.95  CALCIUM 9.4   PT/INR No results for input(s): LABPROT, INR in the last 72 hours. ABG No results for input(s): PHART, HCO3 in the last 72 hours.  Invalid input(s): PCO2, PO2  Studies/Results:  Anti-infectives: Anti-infectives (From admission, onward)   Start     Dose/Rate Route Frequency Ordered Stop   10/21/18 0345  piperacillin-tazobactam (ZOSYN) IVPB 3.375 g     3.375 g 12.5 mL/hr over 240 Minutes Intravenous Every 8 hours 10/21/18 0330     10/21/18 0015  piperacillin-tazobactam (ZOSYN) IVPB 3.375 g  Status:  Discontinued     3.375 g 100 mL/hr over 30 Minutes Intravenous  Once 10/21/18 0015 10/21/18 0015   10/20/18 2330  piperacillin-tazobactam (ZOSYN) IVPB 3.375 g     3.375 g 100 mL/hr over  30 Minutes Intravenous  Once 10/20/18 2317 10/21/18 0308       Assessment/Plan: Patient Active Problem List   Diagnosis Date Noted  . Acute appendicitis 10/21/2018  . Appendicitis 05/11/2018  . Hyponatremia 05/11/2018  . Hypokalemia 05/11/2018  . Dehydration 05/11/2018  . Sepsis ([place]) 05/11/2018  . Intraabdominal fluid collection 05/11/2018  . AKI (acute kidney injury) ([place]) 05/11/2018  . Atrial fibrillation with RVR ([place]) 05/11/2018   s/p Procedure(s): APPENDECTOMY LAPAROSCOPIC 10/21/2018 - perforated appendicitis  -Ambulate 5x/day -Advance to soft diet today -Continue IV abx today, repeat blood work tomorrow - may be able to go home tomorrow on oral abx if continues to do great -PPx: SCDs,   LOS: 1 day   Sharon Mt. Dema Severin, M.D. [facility]

## 2018-10-22 NOTE — Progress Notes (Signed)
Sarah, RRT, in to connect to monitor. No pain. Slightly anxious, reassurance given. Dr. Dema Severin notified of event. MD spoke with Judson Roch, ICU charge nurse.

## 2018-10-22 NOTE — Significant Event (Signed)
Rapid Response Event Note  Overview: Time Called: 6659 Arrival Time: 1446 Event Type: Other (Comment)("Anxiety Attack")  Initial Focused Assessment: Pt resting in bed complains of mild anxiety that she reports is much improved from earlier.   Initial vitals HR 119 ST with occasional PVCs BP 172/97 (117) RR 21 100 RA  Interventions: Rapid remained at patients bedside to monitor for cardiac changes. BMP and Troponin ordered. After about 10 minutes patient HR up to 150s . EKG showed SVT. Patient remained in rhythm for 10 minutes and converted without intervention back to NSR. MD notified. Cardiac consult put in. Pt then converts back into rapid rhythm which appears to be more irregular and atrial fibrillation. Pt remains non symptomatic. Pt transferred to SDU 1241.   Event Summary: Pt in atrial fibrillation. Transferred to SDU room 1241. Cardizem gtt initiated.       Wray Kearns

## 2018-10-22 NOTE — Consult Note (Signed)
CONSULTATION NOTE   Patient Name: Monique HeritageJane Rauch Date of Encounter: 10/22/2018 Cardiologist: No primary care provider on file.  Chief Complaint   No complaints  Patient Profile   73 year old female with no significant medical history but not followed regularly by physician, presented with acute appendicitis and found postoperatively to have rapid narrow complex tachycardia  HPI   Monique Castillo is a 73 y.o. female who is being seen today for the evaluation of tachycardia at the request of Dr Carolynne Edouardoth.  This is a pleasant 73 year old female who presented with a day history of right lower quadrant abdominal pain without nausea or vomiting.  She had a perforated appendix diagnosed in March 2020 treated with a percutaneous drain by IR due to COVID she was not operated on.  Apparently at the time she had had A. fib with RVR but refused anticoagulation or medical therapy.  She told me today she was "not fond of doctors her medicines".  Unfortunately due to the emergent nature of her acute appendicitis, she was taken to the operating room today.  She had a successful laparoscopic appendectomy.  Currently she denies any pain or worsening shortness of breath.  She is unaware of palpitations.  She has persistent tachycardia which appears narrow complex at rates between 130 and 150.  The underlying rhythm appears to be atrial flutter.  Did have an echocardiogram in March which showed an LVEF 60 to 65%, mild LVH, aortic valve sclerosis and mild left atrial enlargement.  PMHx   History reviewed. No pertinent past medical history.  Past Surgical History:  Procedure Laterality Date  . IR RADIOLOGIST EVAL & MGMT  06/01/2018  . LAPAROSCOPIC APPENDECTOMY N/A 10/21/2018   Procedure: APPENDECTOMY LAPAROSCOPIC;  Surgeon: Griselda Mineroth, Paul III, MD;  Location: [facility];  Service: General;  Laterality: N/A;  . PARTIAL HYSTERECTOMY      FAMHx   Family History  Problem Relation Age of Onset  . Seizures Father   . Ulcers Father    . Heart failure Father     SOCHx    reports that she has never smoked. She has never used smokeless tobacco. She reports current alcohol use. She reports that she does not use drugs.  Outpatient Medications   No current facility-administered medications on file prior to encounter.    Current Outpatient Medications on File Prior to Encounter  Medication Sig Dispense Refill  . ibuprofen (ADVIL,MOTRIN) 200 MG tablet Take 400 mg by mouth every 6 (six) hours as needed for moderate pain.    . [DISCONTINUED] diltiazem (CARDIZEM CD) 240 MG 24 hr capsule Take 1 capsule (240 mg total) by mouth daily. (Patient not taking: Reported on 10/20/2018) 30 capsule 2    Inpatient Medications    Scheduled Meds: . Chlorhexidine Gluconate Cloth  6 each Topical Daily  . diltiazem  20 mg Intravenous Once    Continuous Infusions: . diltiazem (CARDIZEM) infusion    . piperacillin-tazobactam (ZOSYN)  IV 3.375 g (10/22/18 1317)    PRN Meds: acetaminophen, HYDROmorphone (DILAUDID) injection, metoprolol tartrate, ondansetron **OR** ondansetron (ZOFRAN) IV, oxyCODONE, zolpidem   ALLERGIES   Allergies  Allergen Reactions  . Sulfa Antibiotics Other (See Comments)    Childhood allergy    ROS   Pertinent items noted in HPI and remainder of comprehensive ROS otherwise negative.  Vitals   Vitals:   10/21/18 2108 10/22/18 0047 10/22/18 0411 10/22/18 1640  BP: (!) 133/96 (!) 161/115 (!) 153/98 (!) 188/117  Pulse: (!) 108 (!) 107 90  Resp:   18 (!) 25  Temp:  97.8 F (36.6 C) 97.8 F (36.6 C)   TempSrc:  Oral Oral   SpO2: 93% 96% 96%   Weight:      Height:        Intake/Output Summary (Last 24 hours) at 10/22/2018 1704 Last data filed at 10/22/2018 1300 Gross per 24 hour  Intake 1149.24 ml  Output 100 ml  Net 1049.24 ml   Filed Weights   10/20/18 1756  Weight: 74.4 kg    Physical Exam   General appearance: alert and no distress Neck: no carotid bruit, no JVD and thyroid not  enlarged, symmetric, no tenderness/mass/nodules Lungs: clear to auscultation bilaterally Heart: Regular tachycardia, no murmur Abdomen: Mild tenderness to palpation, postsurgical dressings in place, JP drain Extremities: extremities normal, atraumatic, no cyanosis or edema Pulses: 2+ and symmetric Skin: Pale, warm, dry Neurologic: Grossly normal Psych: Pleasant  Labs   Results for orders placed or performed during the hospital encounter of 10/20/18 (from the past 48 hour(s))  Lipase, blood     Status: None   Collection Time: 10/20/18  6:15 PM  Result Value Ref Range   Lipase 28 11 - 51 U/L    Comment: Performed at [facility], [place] [address] [place], [place] [address]  Comprehensive metabolic panel     Status: Abnormal   Collection Time: 10/20/18  6:15 PM  Result Value Ref Range   Sodium 139 135 - 145 mmol/L   Potassium 3.7 3.5 - 5.1 mmol/L   Chloride 104 98 - 111 mmol/L   CO2 23 22 - 32 mmol/L   Glucose, Bld 136 (H) 70 - 99 mg/dL   BUN 17 8 - 23 mg/dL   Creatinine, Ser 0.95 0.44 - 1.00 mg/dL   Calcium 9.4 8.9 - 10.3 mg/dL   Total Protein 7.6 6.5 - 8.1 g/dL   Albumin 4.0 3.5 - 5.0 g/dL   AST 16 15 - 41 U/L   ALT 27 0 - 44 U/L   Alkaline Phosphatase 58 38 - 126 U/L   Total Bilirubin 1.3 (H) 0.3 - 1.2 mg/dL   GFR calc non Af Amer 60 (L) >60 mL/min   GFR calc Af Amer >60 >60 mL/min   Anion gap 12 5 - 15    Comment: Performed at [facility], [place] [address] [place], [place] [address]  CBC     Status: Abnormal   Collection Time: 10/20/18  6:15 PM  Result Value Ref Range   WBC 22.0 (H) 4.0 - 10.5 K/uL   RBC 4.50 3.87 - 5.11 MIL/uL   Hemoglobin 13.6 12.0 - 15.0 g/dL   HCT 42.4 36.0 - 46.0 %   MCV 94.2 80.0 - 100.0 fL   MCH 30.2 26.0 - 34.0 pg   MCHC 32.1 30.0 - 36.0 g/dL   RDW 12.8 11.5 - 15.5 %   Platelets 245 150 - 400 K/uL   nRBC 0.0 0.0 - 0.2 %    Comment: Performed at [facility], [place] [address]  [place], [place] [address]  Lactic acid, plasma     Status: None   Collection Time: 10/20/18 10:04 PM  Result Value Ref Range   Lactic Acid, Venous 1.6 0.5 - 1.9 mmol/L    Comment: Performed at [facility], [place] [address] [place], [place] [address]  Urinalysis, Routine w reflex microscopic     Status: Abnormal   Collection Time: 10/20/18 10:43 PM  Result Value Ref Range   Color, Urine AMBER (A) YELLOW    Comment: BIOCHEMICALS MAY BE AFFECTED BY COLOR   APPearance HAZY (A) CLEAR   Specific Gravity, Urine 1.026 1.005 - 1.030   pH 5.0 5.0 - 8.0   Glucose, UA NEGATIVE NEGATIVE mg/dL   Hgb urine dipstick NEGATIVE NEGATIVE   Bilirubin Urine NEGATIVE NEGATIVE   Ketones, ur 5 (A) NEGATIVE mg/dL   Protein, ur 161 (A) NEGATIVE mg/dL   Nitrite NEGATIVE NEGATIVE   Leukocytes,Ua TRACE (A) NEGATIVE   RBC / HPF 0-5 0 - 5 RBC/hpf   WBC, UA 6-10 0 - 5 WBC/hpf   Bacteria, UA NONE SEEN NONE SEEN   Squamous Epithelial / LPF 0-5 0 - 5   Mucus PRESENT    Hyaline Casts, UA PRESENT     Comment: Performed at [facility], [address], [place], [place] [address]  Blood culture (routine x 2)     Status: None (Preliminary result)   Collection Time: 10/20/18 11:17 PM   Specimen: BLOOD  Result Value Ref Range   Specimen Description      BLOOD LEFT ANTECUBITAL Performed at [facility], [address], [place], [place] [address]    Special Requests      BOTTLES DRAWN AEROBIC AND ANAEROBIC Blood Culture adequate volume Performed at [facility], [address] [place], [place] [address]    Culture      NO GROWTH 1 DAY Performed at [facility], [address] [place], [place] [address]    Report Status PENDING   Blood culture (routine x 2)     Status: None (Preliminary result)   Collection Time: 10/20/18 11:22 PM   Specimen: BLOOD  Result Value Ref Range   Specimen Description      BLOOD BLOOD RIGHT FOREARM  Performed at [facility], [address] [place], [place] [address]    Special Requests      BOTTLES DRAWN AEROBIC AND ANAEROBIC Blood Culture adequate volume Performed at [facility], [address], [place], [place] [address]    Culture      NO GROWTH 1 DAY Performed at [facility], [address] [place], [place] [address]    Report Status PENDING   SARS Coronavirus 2 [facility] order, Performed in [facility] hospital lab) Nasopharyngeal Nasopharyngeal Swab     Status: None   Collection Time: 10/21/18 12:45 AM   Specimen: Nasopharyngeal Swab  Result Value Ref Range   SARS Coronavirus 2 NEGATIVE NEGATIVE    Comment: (NOTE) If result is NEGATIVE SARS-CoV-2 target nucleic acids are NOT DETECTED. The SARS-CoV-2 RNA is generally detectable in upper and lower  respiratory specimens during the acute phase of infection. The lowest  concentration of SARS-CoV-2 viral copies this assay can detect is 250  copies / mL. A negative result does not preclude SARS-CoV-2 infection  and should not be used as the sole basis for treatment or other  patient management decisions.  A negative result may occur with  improper specimen collection / handling, submission of specimen other  than nasopharyngeal swab, presence of viral mutation(s) within the  areas targeted by this assay, and inadequate number of viral copies  (<250 copies / mL). A negative result must be combined with clinical  observations, patient history, and epidemiological information. If result is POSITIVE SARS-CoV-2 target nucleic acids are DETECTED. The SARS-CoV-2 RNA is generally detectable in upper and  lower  respiratory specimens dur ing the acute phase of infection.  Positive  results are indicative of active infection with SARS-CoV-2.  Clinical  correlation with patient history and other diagnostic information is  necessary to determine patient infection status.  Positive results  do  not rule out bacterial infection or co-infection with other viruses. If result is PRESUMPTIVE POSTIVE SARS-CoV-2 nucleic acids MAY BE PRESENT.   A presumptive positive result was obtained on the submitted specimen  and confirmed on repeat testing.  While 2019 novel coronavirus  (SARS-CoV-2) nucleic acids may be present in the submitted sample  additional confirmatory testing may be necessary for epidemiological  and / or clinical management purposes  to differentiate between  SARS-CoV-2 and other Sarbecovirus currently known to infect humans.  If clinically indicated additional testing with an alternate test  methodology [phone]) is advised. The SARS-CoV-2 RNA is generally  detectable in upper and lower respiratory sp ecimens during the acute  phase of infection. The expected result is Negative. Fact Sheet for Patients:  BoilerBrush.com.cy Fact Sheet for Healthcare Providers: https://pope.com/ This test is not yet approved or cleared by the [place] FDA and has been authorized for detection and/or diagnosis of SARS-CoV-2 by FDA under an Emergency Use Authorization (EUA).  This EUA will remain in effect (meaning this test can be used) for the duration of the COVID-19 declaration under Section 564(b)(1) of the Act, 21 U.S.C. section 360bbb-3(b)(1), unless the authorization is terminated or revoked sooner. Performed at [facility], [address], [place], [place] [address]   Surgical pcr screen     Status: None   Collection Time: 10/21/18  9:19 AM   Specimen: Nasal Mucosa; Nasal Swab  Result Value Ref Range   MRSA, PCR NEGATIVE NEGATIVE   Staphylococcus aureus NEGATIVE NEGATIVE    Comment: (NOTE) The Xpert SA Assay (FDA approved for NASAL specimens in patients 59 years of age and older), is one component of a comprehensive surveillance program. It is not intended to diagnose infection nor to guide or  monitor treatment. Performed at [facility], [address] [place], [place] [address]   CBC     Status: Abnormal   Collection Time: 10/22/18  3:00 AM  Result Value Ref Range   WBC 18.3 (H) 4.0 - 10.5 K/uL   RBC 3.54 (L) 3.87 - 5.11 MIL/uL   Hemoglobin 10.9 (L) 12.0 - 15.0 g/dL   HCT 47.8 (L) 29.5 - 62.1 %   MCV 96.3 80.0 - 100.0 fL   MCH 30.8 26.0 - 34.0 pg   MCHC 32.0 30.0 - 36.0 g/dL   RDW 30.8 65.7 - 84.6 %   Platelets 210 150 - 400 K/uL   nRBC 0.0 0.0 - 0.2 %    Comment: Performed at [facility], [address] [place], [place] [address]  Comprehensive metabolic panel     Status: Abnormal   Collection Time: 10/22/18  3:46 PM  Result Value Ref Range   Sodium 139 135 - 145 mmol/L   Potassium 3.6 3.5 - 5.1 mmol/L   Chloride 103 98 - 111 mmol/L   CO2 24 22 - 32 mmol/L   Glucose, Bld 129 (H) 70 - 99 mg/dL   BUN 15 8 - 23 mg/dL   Creatinine, Ser 2.84 0.44 - 1.00 mg/dL   Calcium 9.1 8.9 - 13.2 mg/dL   Total Protein 7.2 6.5 - 8.1 g/dL   Albumin 3.2 (L) 3.5 - 5.0 g/dL  AST 13 (L) 15 - 41 U/L   ALT 23 0 - 44 U/L   Alkaline Phosphatase 58 38 - 126 U/L   Total Bilirubin 0.4 0.3 - 1.2 mg/dL   GFR calc non Af Amer 58 (L) >60 mL/min   GFR calc Af Amer >60 >60 mL/min   Anion gap 12 5 - 15    Comment: Performed at [facility], [address] [place], [place] [address]  Troponin I (High Sensitivity)     Status: None   Collection Time: 10/22/18  3:46 PM  Result Value Ref Range   Troponin I (High Sensitivity) 7 <18 ng/L    Comment: (NOTE) Elevated high sensitivity troponin I (hsTnI) values and significant  changes across serial measurements may suggest ACS but many other  chronic and acute conditions are known to elevate hsTnI results.  Refer to the "Links" section for chest pain algorithms and additional  guidance. Performed at [facility], [address] [place], [place] [address]     ECG    Multiple EKG shows sinus tachycardia and narrow complex tachycardia which is either atrial flutter versus atrial tachycardia- Personally Reviewed  Telemetry   No complex tachycardia between 130 and 150, suspect atrial flutter- Personally Reviewed  Radiology   Ct Abdomen Pelvis W Contrast  Result Date: 10/20/2018 CLINICAL DATA:  Abdominal pain, right lower quadrant pain EXAM: CT ABDOMEN AND PELVIS WITH CONTRAST TECHNIQUE: Multidetector CT imaging of the abdomen and pelvis was performed using the standard protocol following bolus administration of intravenous contrast. CONTRAST:  100mL OMNIPAQUE IOHEXOL 300 MG/ML  SOLN COMPARISON:  06/01/2018 FINDINGS: Lower chest: Cardiomegaly. Linear areas of scarring and/or atelectasis in the lung bases. No effusions. Hepatobiliary: Scattered calcifications compatible with old granulomatous disease. No focal hepatic stat no suspicious focal hepatic abnormality or biliary ductal dilatation. Gallbladder unremarkable. Pancreas: No focal abnormality or ductal dilatation. Spleen: Calcifications throughout the spleen.  Normal size. Adrenals/Urinary Tract: Bilateral renal parapelvic cysts. No hydronephrosis. Adrenal glands unremarkable. Urinary bladder decompressed, grossly unremarkable. Stomach/Bowel: The appendix is dilated and contains appendicoliths. Diameter of the appendix 10 mm. There is surrounding inflammation. There also appears to be a recurrent fluid collection in the right lower quadrant in the area of prior drain. This measures up to 2.8 cm. Stomach and small bowel decompressed. Colon grossly unremarkable. No evidence of bowel obstruction. Vascular/Lymphatic: Aortic atherosclerosis. No enlarged abdominal or pelvic lymph nodes. Reproductive: Uterus and adnexa unremarkable.  No mass. Other: No free fluid or free air. Musculoskeletal: No acute bony abnormality. IMPRESSION: Dilated, inflamed appendix with appendicoliths again noted. Findings again compatible with  appendicitis and is similar to prior study. There is recurrent fluid collection in the right lower quadrant superior to the appendix which has previously been drained. This measures 2.8 cm. 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 Aortic atherosclerosis. Electronically Signed   By: Charlett NoseKevin  Dover M.D.   On: 10/20/2018 23:35    Cardiac Studies   N/A  Impression   Active Problems:   Acute appendicitis   Narrow complex tachycardia (HCC)   Hypertensive urgency   Recommendation   1. Mrs. Shepler has a narrow complex tachycardia which I suspect is atrial flutter however may be an atrial tachycardia.  She is also had sinus tachycardia providing hope that she might convert spontaneously on her own.  She had a history of atrial fibrillation which was noted during her incident hospitalization for appendix rupture in March 2020.  She had refused anticoagulation at  that time.  Her CHADVASC score is 2 (hypertension - untreated, female). Her echo showed mild left atrial enlargement and normal LV function.  Since she is immediately postoperative I would not recommend anticoagulation.  Would recommend rate control and given her elevated blood pressure, okay to give 20 mg IV diltiazem bolus followed by infusion at 10 to 15 mg/h.  She was already ordered for IV beta-blocker.  Will need additional blood pressure control. TSH normal in March.  Thanks for the consultation.  Cardiology will follow closely with you.  Time Spent Directly with Patient:  I have spent a total of 45 minutes with the patient reviewing hospital notes, telemetry, EKGs, labs and examining the patient as well as establishing an assessment and plan that was discussed personally with the patient.  > 50% of time was spent in direct patient care.  Length of Stay:  LOS: 1 day   Chrystie Nose, MD, [facility], FACP  [place]  [facility]  Medical Director of the Advanced Lipid Disorders &  Cardiovascular Risk Reduction Clinic  Diplomate of the American Board of Clinical Lipidology Attending Cardiologist  Direct Dial: [phone]  Fax: [phone]  Website:  [email or web address] 10/22/2018, 5:04 PM

## 2018-10-22 NOTE — Progress Notes (Signed)
Pt heart rate fluctuating high as 160 and as low as 61. BP was as high 180/125 but currently 163/97. Pt states "I'm just anxious." Heart rate continues to fluctuate. RRT notified. Color good, and continues to blame "nerves."

## 2018-10-23 ENCOUNTER — Other Ambulatory Visit: Payer: Self-pay

## 2018-10-23 MED ORDER — DIPHENHYDRAMINE HCL 12.5 MG/5ML PO ELIX
12.5000 mg | ORAL_SOLUTION | Freq: Four times a day (QID) | ORAL | Status: DC | PRN
  Administered 2018-10-23: 12.5 mg via ORAL
  Filled 2018-10-23: qty 5

## 2018-10-23 MED ORDER — DILTIAZEM HCL 60 MG PO TABS
60.0000 mg | ORAL_TABLET | Freq: Three times a day (TID) | ORAL | Status: DC
  Administered 2018-10-23 – 2018-10-24 (×4): 60 mg via ORAL
  Filled 2018-10-23 (×6): qty 1

## 2018-10-23 MED ORDER — LIP MEDEX EX OINT
TOPICAL_OINTMENT | CUTANEOUS | Status: AC
  Administered 2018-10-23: 20:00:00
  Filled 2018-10-23: qty 7

## 2018-10-23 MED ORDER — ENOXAPARIN SODIUM 40 MG/0.4ML ~~LOC~~ SOLN
40.0000 mg | SUBCUTANEOUS | Status: DC
  Filled 2018-10-23 (×2): qty 0.4

## 2018-10-23 NOTE — Progress Notes (Signed)
[facility] Office:  [phone] General Surgery Progress Note   LOS: 2 days  POD -  2 Days Post-Op  Chief Complaint: Abdominal pain  Assessment and Plan: 1.  APPENDECTOMY LAPAROSCOPIC - 10/21/2018 Marlou Starks  For focal ruptured appendicitis  Has drain  On Zosyn  On reg diet  Transfer back to floor today - assuming cardiology will switch her to oral diltiazem  2.  SVT - back in sinus rhythm  On IV diltiazem 3.  HTN 4.  DVT prophylaxis - no on chemoprophylaxis - will add   Active Problems:   Acute appendicitis   Narrow complex tachycardia (HCC)   Hypertensive urgency  Subjective:  Tolerated diet - had hamburger yesterday.   Passed some flatus, no BM.  Husband in hospital for colon resection by Dr. Johney Maine.  Objective:   Vitals:   10/23/18 0600 10/23/18 0700  BP: (!) 161/80 (!) 175/109  Pulse: 77 81  Resp: 14 16  Temp:    SpO2: 91% 93%     Intake/Output from previous day:  08/29 0701 - 08/30 0700 In: 1199.8 [P.O.:240; I.V.:803.1; IV Piggyback:156.7] Out: 840 [Urine:800; Drains:40]  Intake/Output this shift:  No intake/output data recorded.   Physical Exam:   General: WN older WF who is alert and oriented.    HEENT: Normal. Pupils equal. .   Lungs: Clear   Abdomen: Distended.  Has BS   Wound: Drain right abdomen   Lab Results:    Recent Labs    10/20/18 1815 10/22/18 0300  WBC 22.0* 18.3*  HGB 13.6 10.9*  HCT 42.4 34.1*  PLT 245 210    BMET   Recent Labs    10/20/18 1815 10/22/18 1546  NA 139 139  K 3.7 3.6  CL 104 103  CO2 23 24  GLUCOSE 136* 129*  BUN 17 15  CREATININE 0.95 0.98  CALCIUM 9.4 9.1    PT/INR  No results for input(s): LABPROT, INR in the last 72 hours.  ABG  No results for input(s): PHART, HCO3 in the last 72 hours.  Invalid input(s): PCO2, PO2   Studies/Results:  No results found.   Anti-infectives:   Anti-infectives (From admission, onward)   Start     Dose/Rate Route Frequency Ordered Stop   10/21/18 0345  piperacillin-tazobactam (ZOSYN) IVPB 3.375 g     3.375 g 12.5 mL/hr over 240 Minutes Intravenous Every 8 hours 10/21/18 0330     10/21/18 0015  piperacillin-tazobactam (ZOSYN) IVPB 3.375 g  Status:  Discontinued     3.375 g 100 mL/hr over 30 Minutes Intravenous  Once 10/21/18 0015 10/21/18 0015   10/20/18 2330  piperacillin-tazobactam (ZOSYN) IVPB 3.375 g     3.375 g 100 mL/hr over 30 Minutes Intravenous  Once 10/20/18 2317 10/21/18 0308      Alphonsa Overall, MD, FACS Pager: [facility] Office: [phone] 10/23/2018

## 2018-10-23 NOTE — Progress Notes (Signed)
Progress Note  Patient Name: Monique Castillo Date of Encounter: 10/23/2018  Primary Cardiologist: No primary care provider on file. New - [facility]  Subjective   Feels well. SVT stopped around 6 PM. Had brief bursts of tachycardia after that for about an hour, but   Inpatient Medications    Scheduled Meds: . Chlorhexidine Gluconate Cloth  6 each Topical Daily  . enoxaparin (LOVENOX) injection  40 mg Subcutaneous Q24H   Continuous Infusions: . diltiazem (CARDIZEM) infusion 5 mg/hr (10/23/18 0309)  . piperacillin-tazobactam (ZOSYN)  IV Stopped (10/23/18 0903)   PRN Meds: acetaminophen, diphenhydrAMINE, HYDROmorphone (DILAUDID) injection, metoprolol tartrate, ondansetron **OR** ondansetron (ZOFRAN) IV, oxyCODONE, zolpidem   Vital Signs    Vitals:   10/23/18 0600 10/23/18 0700 10/23/18 0742 10/23/18 0800  BP: (!) 161/80 (!) 175/109 (!) 184/101   Pulse: 77 81 83   Resp: 14 16 16    Temp:    98.1 F (36.7 C)  TempSrc:      SpO2: 91% 93% 95%   Weight:      Height:        Intake/Output Summary (Last 24 hours) at 10/23/2018 0939 Last data filed at 10/23/2018 0500 Gross per 24 hour  Intake 1199.83 ml  Output 830 ml  Net 369.83 ml   Last 3 Weights 10/20/2018 05/11/2018  Weight (lbs) 164 lb 165 lb  Weight (kg) 74.39 kg 74.844 kg      Telemetry    SR with PACs, no runs of atrial tachycardia since 1900h - Personally Reviewed  ECG    SR w PACs, otw normal - Personally Reviewed  Physical Exam  Appears well GEN: No acute distress.   Neck: No JVD Cardiac: RRR, no murmurs, rubs, or gallops.  Respiratory: Clear to auscultation bilaterally. GI: Soft, nontender, non-distended  MS: No edema; No deformity. Neuro:  Nonfocal  Psych: Normal affect   Labs    High Sensitivity Troponin:   Recent Labs  Lab 10/22/18 1546 10/22/18 1713  TROPONINIHS 7 9      Chemistry Recent Labs  Lab 10/20/18 1815 10/22/18 1546  NA 139 139  K 3.7 3.6  CL 104 103  CO2 23 24  GLUCOSE 136*  129*  BUN 17 15  CREATININE 0.95 0.98  CALCIUM 9.4 9.1  PROT 7.6 7.2  ALBUMIN 4.0 3.2*  AST 16 13*  ALT 27 23  ALKPHOS 58 58  BILITOT 1.3* 0.4  GFRNONAA 60* 58*  GFRAA >60 >60  ANIONGAP 12 12     Hematology Recent Labs  Lab 10/20/18 1815 10/22/18 0300  WBC 22.0* 18.3*  RBC 4.50 3.54*  HGB 13.6 10.9*  HCT 42.4 34.1*  MCV 94.2 96.3  MCH 30.2 30.8  MCHC 32.1 32.0  RDW 12.8 13.0  PLT 245 210    BNPNo results for input(s): BNP, PROBNP in the last 168 hours.   DDimer No results for input(s): DDIMER in the last 168 hours.   Radiology    No results found.  Cardiac Studies   05/12/2018 Echo    1. The left ventricle has normal systolic function with an ejection fraction of 60-65%. The cavity size was normal. There is mildly increased left ventricular wall thickness. Left ventricular diastolic Doppler parameters are indeterminate. Indeterminate  filling pressures.  2. The right ventricle has normal systolic function. The cavity was normal. There is no increase in right ventricular wall thickness.  3. Left atrial size was mildly dilated.  4. The mitral valve is grossly normal. Mild thickening of  the mitral valve leaflet. There is mild mitral annular calcification present.  5. The tricuspid valve is grossly normal.  6. The aortic valve is tricuspid Mild sclerosis of the aortic valve.  7. The inferior vena cava was dilated in size with <50% respiratory variability.   Patient Profile     73 y.o. female with regular SVT (likely atrial flutter with 2:1 AV block, but also bursts of ectopic atrial tachycardia) in setting of hyperadrenergic postop state and untreated HTN.  Assessment & Plan    1. SVT/ A flutter: resolved. Switch to PO diltiazem. Makes it clear she will not take long term meds for arrhythmia or for BP, but does take ASA 81 mg daily. I will make her a follow up appt, but she is unlikely to keep it. 2. HTN: does not want to treat it with meds. Gave her  instructions for DASH diet. Encouraged more execise. [facility] will sign off.   Medication Recommendations:  Diltiazem 60 mg TID (if she agrees, DC home on diltiazem SR24h 180 mg daily). Other recommendations (labs, testing, etc):  n/a Follow up as an outpatient:  In 1 month with Cardiology  For questions or updates, please contact [facility] Please consult [email or web address] for contact info under        Signed, Thurmon FairMihai Tazaria Dlugosz, MD  10/23/2018, 9:39 AM

## 2018-10-23 NOTE — Progress Notes (Signed)
PHARMACIST - PHYSICIAN COMMUNICATION CONCERNING: IV to Oral Route Change Policy  RECOMMENDATION: This patient is receiving diphenhydramine by the intravenous route.  Based on criteria approved by the Pharmacy and Therapeutics Committee, intravenous diphenhydramine is being converted to the equivalent oral dose form(s).   DESCRIPTION: These criteria include:  Diphenhydramine is not prescribed to treat or prevent a severe allergic reaction  Diphenhydramine is not prescribed as premedication prior to receiving blood product, biologic medication, antimicrobial, or chemotherapy agent  The patient has tolerated at least one dose of an oral or enteral medication  The patient has no evidence of active gastrointestinal bleeding or impaired GI absorption (gastrectomy, short bowel, patient on TNA or NPO).  The patient is not undergoing procedural sedation   If you have questions about this conversion, please contact the Pharmacy Department  []  ( [phone] )  [place] []  ( [phone]  [facility] []  ( [phone] )  [place] []  ( [phone] )  [facility] [x]  ( [phone] )  [facility]   

## 2018-10-23 NOTE — Plan of Care (Signed)
Patient arrived from ICU via wheelchair; ambulated from doorway to bathroom and back to bed. Pain controlled at this time. Will continue to monitor.

## 2018-10-24 LAB — BASIC METABOLIC PANEL
Anion gap: 9 (ref 5–15)
BUN: 11 mg/dL (ref 8–23)
CO2: 24 mmol/L (ref 22–32)
Calcium: 7.9 mg/dL — ABNORMAL LOW (ref 8.9–10.3)
Chloride: 103 mmol/L (ref 98–111)
Creatinine, Ser: 0.9 mg/dL (ref 0.44–1.00)
GFR calc Af Amer: >60 mL/min (ref >60)
GFR calc non Af Amer: >60 mL/min (ref >60)
Glucose, Bld: 101 mg/dL — ABNORMAL HIGH (ref 70–99)
Potassium: 3.3 mmol/L — ABNORMAL LOW (ref 3.5–5.1)
Sodium: 136 mmol/L (ref 135–145)

## 2018-10-24 LAB — CBC WITH DIFFERENTIAL/PLATELET
Abs Immature Granulocytes: 0.06 10*3/uL (ref 0.00–0.07)
Basophils Absolute: 0 10*3/uL (ref 0.0–0.1)
Basophils Relative: 0 %
Eosinophils Absolute: 0.2 10*3/uL (ref 0.0–0.5)
Eosinophils Relative: 2 %
HCT: 37 % (ref 36.0–46.0)
Hemoglobin: 11.6 g/dL — ABNORMAL LOW (ref 12.0–15.0)
Immature Granulocytes: 1 %
Lymphocytes Relative: 8 %
Lymphs Abs: 0.8 10*3/uL (ref 0.7–4.0)
MCH: 30.2 pg (ref 26.0–34.0)
MCHC: 31.4 g/dL (ref 30.0–36.0)
MCV: 96.4 fL (ref 80.0–100.0)
Monocytes Absolute: 1 10*3/uL (ref 0.1–1.0)
Monocytes Relative: 9 %
Neutro Abs: 8.5 10*3/uL — ABNORMAL HIGH (ref 1.7–7.7)
Neutrophils Relative %: 80 %
Platelets: 236 10*3/uL (ref 150–400)
RBC: 3.84 MIL/uL — ABNORMAL LOW (ref 3.87–5.11)
RDW: 12.9 % (ref 11.5–15.5)
WBC: 10.7 10*3/uL — ABNORMAL HIGH (ref 4.0–10.5)
nRBC: 0 % (ref 0.0–0.2)

## 2018-10-24 MED ORDER — AMOXICILLIN-POT CLAVULANATE 875-125 MG PO TABS: 1.0000 | ORAL_TABLET | Freq: Two times a day (BID) | ORAL | 0 refills | Status: AC

## 2018-10-24 MED ORDER — IBUPROFEN 200 MG PO TABS: 600.0000 mg | ORAL_TABLET | Freq: Four times a day (QID) | ORAL | Status: DC | PRN

## 2018-10-24 MED ORDER — AMOXICILLIN-POT CLAVULANATE 875-125 MG PO TABS
1.0000 | ORAL_TABLET | Freq: Two times a day (BID) | ORAL | Status: DC
  Administered 2018-10-24: 11:00:00 1 via ORAL
  Filled 2018-10-24: qty 1

## 2018-10-24 MED ORDER — OXYCODONE HCL 5 MG PO TABS: 5.0000 mg | ORAL_TABLET | Freq: Four times a day (QID) | ORAL | 0 refills | Status: AC | PRN

## 2018-10-24 MED ORDER — DILTIAZEM HCL ER COATED BEADS 180 MG PO CP24: 180.0000 mg | ORAL_CAPSULE | Freq: Every day | ORAL | 0 refills | Status: AC

## 2018-10-24 MED ORDER — DILTIAZEM HCL 60 MG PO TABS: 60.0000 mg | ORAL_TABLET | Freq: Three times a day (TID) | ORAL | 0 refills | Status: DC

## 2018-10-24 MED ORDER — POLYETHYLENE GLYCOL 3350 17 G PO PACK
17.0000 g | PACK | Freq: Once | ORAL | Status: AC
  Administered 2018-10-24: 09:00:00 17 g via ORAL

## 2018-10-24 MED ORDER — IBUPROFEN 200 MG PO TABS: ORAL_TABLET | ORAL | 0 refills | Status: AC

## 2018-10-24 NOTE — Progress Notes (Signed)
Discharge and medication instructions reviewed with patient. Questions answered and patient denies further questions. Family member en route to drive patient home. Sarinah Doetsch, RN 

## 2018-10-24 NOTE — Progress Notes (Addendum)
3 Days Post-Op    CC: Abdominal pain  Subjective: Patient is doing well this AM, and wants to go home.  She is constipated not had a bowel movement since she has been here.  She would like a dose of MiraLAX and a BM before she goes.  She had an accident going home last time.  She is using oxycodone but is constipated we talked about that.  She does not like Tylenol but she will use ibuprofen.  We talked about that.  She remains adamant about not taking, blood pressure/anticoagulant medications at home.  Objective: Vital signs in last 24 hours: Temp:  [98.1 F (36.7 C)-98.4 F (36.9 C)] 98.4 F (36.9 C) (08/31 0502) Pulse Rate:  [79-94] 84 (08/31 0502) Resp:  [15-20] 16 (08/31 0502) BP: (154-180)/(86-101) 180/101 (08/31 0502) SpO2:  [91 %-96 %] 96 % (08/31 0502)    Intake/Output from previous day: 08/30 0701 - 08/31 0700 In: 621.8 [P.O.:360; I.V.:76.8; IV Piggyback:185] Out: 155 [Urine:150; Drains:5] Intake/Output this shift: No intake/output data recorded.  General appearance: alert, cooperative and no distress Resp: clear to auscultation bilaterally GI: Soft, sore, port sites look fine.  JP drain is clear and serous.  Lab Results:  Recent Labs    10/22/18 0300 10/24/18 0335  WBC 18.3* 10.7*  HGB 10.9* 11.6*  HCT 34.1* 37.0  PLT 210 236    BMET Recent Labs    10/22/18 1546 10/24/18 0335  NA 139 136  K 3.6 3.3*  CL 103 103  CO2 24 24  GLUCOSE 129* 101*  BUN 15 11  CREATININE 0.98 0.90  CALCIUM 9.1 7.9*   PT/INR No results for input(s): LABPROT, INR in the last 72 hours.  Recent Labs  Lab 10/20/18 1815 10/22/18 1546  AST 16 13*  ALT 27 23  ALKPHOS 58 58  BILITOT 1.3* 0.4  PROT 7.6 7.2  ALBUMIN 4.0 3.2*     Lipase     Component Value Date/Time   LIPASE 28 10/20/2018 1815     Medications: . Chlorhexidine Gluconate Cloth  6 each Topical Daily  . diltiazem  60 mg Oral Q8H  . enoxaparin (LOVENOX) injection  40 mg Subcutaneous Q24H   .  piperacillin-tazobactam (ZOSYN)  IV 3.375 g (10/24/18 0515)    Assessment/Plan Hx atrial fibrillation with RVR -refused anticoagulation SVT on Cardizem  -Cardiology following -patient reporting she will not take medications for                    arrhythmia but does take a baby aspirin. Hypertension   -Does not want to be treated  [facility] signed off  Hx perforated appendix 04/2018 -treated with IR drain/antibiotics Purulent appendicitis Laparoscopic appendectomy 10/20/2018, Dr. Autumn Messing POD #4  FEN: Soft diet ID: Zosyn 8/28 >> day 4  Convert to Augmentin 8/31 DVT: Lovenox   Plan: Home later today, will pull drain and convert to Augmentin today.      LOS: 3 days    Aneesh Faller 10/24/2018 [phone]

## 2018-10-24 NOTE — Discharge Summary (Signed)
Physician Discharge Summary  Patient ID: Levie HeritageJane Stahnke MRN: [id number] DOB/AGE: 72-Feb-1947 72 y.o.  Admit date: 10/20/2018 Discharge date: 10/24/2018  Admission Diagnoses: Hx perforated appendix 04/2018 with IR drain placement/medical management Recurrent appendicitis Hx atrial fibrillation with RVR  SVT on Cardizem Hypertension   Discharge Diagnoses:  Hx perforated appendicitis 3/20 -IR drain placement/medical management Recurrent purulent appendicitis Hx atrial fibrillation with RVR -refused anticoagulation SVT on Cardizem - thinking about taking  Active Problems:   Acute appendicitis   Narrow complex tachycardia (HCC)   Hypertensive urgency   PROCEDURES: Laparoscopic appendectomy 10/20/2018 Dr. Carie CaddyPaul Toth  Hospital Course:  1 day hx of RLQ abdominal pain constant made worse with movement location RLQ abdomen no nausea or vomiting  History of perforated appendix march 2020 treated with IR drain due to abscess.  Had a fib RVR but this resolved and she refused anticoagulation.  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  Patient was admitted by Dr. Luisa Hartornett and taken by Dr. Carolynne Edouardoth to the operating room the following morning.  The right lower quadrant was covered with puslike adhesions which were taken down bluntly with suction catheter.  The appendix and right colon was identified and he was able to remove the appendix with a` GIA stapler.  A drain was placed.  Patient returned to the floor and was maintained on IV fluids and IV antibiotics.  She made good progress, her diet was advanced.  She is tolerated diet and pain control with oral medications.  She will follow-up in our office in 2 weeks.  Drain is serosanguineous and was removed prior to discharge on 10/24/2018.  During her original admission for her perforated appendix she was found to have nominally COVID but atrial fibrillation with RVR.  She refused anticoagulation at that  time.  She also refused treatment for her hypertension.  She again had narrow complex tachycardia.  Here the suspect is atrial flutter.  She was placed on oral Cardizem again after rate was controlled with IV Cardizem on 10/22/2018.  On 830/30 she was seen by cardiology again.  She made it clear that she would not take long-term medications for arrhythmias or blood pressure she does take aspirin.  She did not want further treatment for her hypertension.  She was placed on Cardizem 60 mg 3 times daily here they recommended diltiazem SR 24-hour 180 mg daily at discharge if she would take it.  She is to follow-up with cardiology in 1 month.  I also encouraged her to take the Cardizem and follow up with Dr. Rennis GoldenHilty.    She is ready for discharge in the PM 10/24/18.  CBC Latest Ref Rng & Units 10/24/2018 10/22/2018 10/20/2018  WBC 4.0 - 10.5 K/uL 10.7(H) 18.3(H) 22.0(H)  Hemoglobin 12.0 - 15.0 g/dL 11.6(L) 10.9(L) 13.6  Hematocrit 36.0 - 46.0 % 37.0 34.1(L) 42.4  Platelets 150 - 400 K/uL 236 210 245    CMP Latest Ref Rng & Units 10/24/2018 10/22/2018 10/20/2018  Glucose 70 - 99 mg/dL 409(W101(H) 119(J129(H) 478(G136(H)  BUN 8 - 23 mg/dL 11 15 17   Creatinine 0.44 - 1.00 mg/dL 9.560.90 2.130.98 0.860.95  Sodium 135 - 145 mmol/L 136 139 139  Potassium 3.5 - 5.1 mmol/L 3.3(L) 3.6 3.7  Chloride 98 - 111 mmol/L 103 103 104  CO2 22 - 32 mmol/L 24 24 23   Calcium 8.9 - 10.3 mg/dL 7.9(L) 9.1 9.4  Total Protein 6.5 - 8.1 g/dL -  7.2 7.6  Total Bilirubin 0.3 - 1.2 mg/dL - 0.4 1.3(H)  Alkaline Phos 38 - 126 U/L - 58 58  AST 15 - 41 U/L - 13(L) 16  ALT 0 - 44 U/L - 23 27    Condition on Discharge:  Improved   Disposition: Discharge disposition: 01-Home or Self Care     Home   Allergies as of 10/24/2018      Reactions   Sulfa Antibiotics Other (See Comments)   Childhood allergy      Medication List    STOP taking these medications   acetaminophen 325 MG tablet Commonly known as: TYLENOL   aspirin 81 MG EC tablet - this was  stopped for your surgery, but you can take it at home if this is your only anticoagulation.     saccharomyces boulardii 250 MG capsule Commonly known as: FLORASTOR     TAKE these medications   amoxicillin-clavulanate 875-125 MG tablet Commonly known as: AUGMENTIN Take 1 tablet by mouth every 12 (twelve) hours for 4 days.   diltiazem 180 MG 24 hr capsule Commonly known as: Cardizem CD Take 1 capsule (180 mg total) by mouth daily. What changed:   medication strength  how much to take   ibuprofen 200 MG tablet Commonly known as: ADVIL You can take 2-3 tablets every 6 hours as needed for pain.  You can buy this over the counter at any drug store. What changed:   how much to take  how to take this  when to take this  reasons to take this  additional instructions   oxyCODONE 5 MG immediate release tablet Commonly known as: Oxy IR/ROXICODONE Take 1 tablet (5 mg total) by mouth every 6 (six) hours as needed for moderate pain or severe pain (pain not relieved by ibuprofen).      Follow-up Information    [facility] Follow up on 11/08/2018.   Specialty: General Surgery Why: your appointment is at 9:15 AM  .  Be at the office 30 minutes early for check in.  Bring photo ID and insurance information.   Contact information: [place] [phone]        Pixie Casino, MD. Call.   Specialty: Cardiology Why:   for follow up appointment.  This is the heart doctor.   Contact information: [address] [place] [address] [phone]           Signed: Earnstine Regal 10/24/2018, 1:24 PM

## 2018-10-24 NOTE — Discharge Instructions (Signed)
LAPAROSCOPIC SURGERY: POST OP INSTRUCTIONS  ######################################################################  EAT Gradually transition to a high fiber diet with a fiber supplement over the next few weeks after discharge.  Start with a pureed / full liquid diet (see below)  WALK Walk an hour a day.  Control your pain to do that.    CONTROL PAIN Control pain so that you can walk, sleep, tolerate sneezing/coughing, go up/down stairs.  HAVE A BOWEL MOVEMENT DAILY Keep your bowels regular to avoid problems.  OK to try a laxative to override constipation.  OK to use an antidairrheal to slow down diarrhea.  Call if not better after 2 tries  CALL IF YOU HAVE PROBLEMS/CONCERNS Call if you are still struggling despite following these instructions. Call if you have concerns not answered by these instructions  ######################################################################    1. DIET: Follow a light bland diet & liquids the first 24 hours after arrival home, such as soup, liquids, starches, etc.  Be sure to drink plenty of fluids.  Quickly advance to a usual solid diet within a few days.  Avoid fast food or heavy meals as your are more likely to get nauseated or have irregular bowels.  A low-fat, high-fiber diet for the rest of your life is ideal.  2. Take your usually prescribed home medications unless otherwise directed.  3. PAIN CONTROL: a. Pain is best controlled by a usual combination of three different methods TOGETHER: i. Ice/Heat ii. Over the counter pain medication iii. Prescription pain medication b. Most patients will experience some swelling and bruising around the incisions.  Ice packs or heating pads (30-60 minutes up to 6 times a day) will help. Use ice for the first few days to help decrease swelling and bruising, then switch to heat to help relax tight/sore spots and speed recovery.  Some people prefer to use ice alone, heat alone, alternating between ice & heat.   Experiment to what works for you.  Swelling and bruising can take several weeks to resolve.   c. It is helpful to take an over-the-counter pain medication regularly for the first few weeks.  Choose one of the following that works best for you: i. Naproxen (Aleve, etc)  Two 220mg tabs twice a day ii. Ibuprofen (Advil, etc) Three 200mg tabs four times a day (every meal & bedtime) iii. Acetaminophen (Tylenol, etc) 500-650mg four times a day (every meal & bedtime) d. A  prescription for pain medication (such as oxycodone, hydrocodone, tramadol, gabapentin, methocarbamol, etc) should be given to you upon discharge.  Take your pain medication as prescribed.  i. If you are having problems/concerns with the prescription medicine (does not control pain, nausea, vomiting, rash, itching, etc), please call us [phone] to see if we need to switch you to a different pain medicine that will work better for you and/or control your side effect better. ii. If you need a refill on your pain medication, please give us 48 hour notice.  contact your pharmacy.  They will contact our office to request authorization. Prescriptions will not be filled after 5 pm or on week-ends  4. Avoid getting constipated.   a. Between the surgery and the pain medications, it is common to experience some constipation.   b. Increasing fluid intake and taking a fiber supplement (such as Metamucil, Citrucel, FiberCon, MiraLax, etc) 1-2 times a day regularly will usually help prevent this problem from occurring.   c. A mild laxative (prune juice, Milk of Magnesia, MiraLax, etc) should be taken according to   package directions if there are no bowel movements after 48 hours.   5. Watch out for diarrhea.   a. If you have many loose bowel movements, simplify your diet to bland foods & liquids for a few days.   b. Stop any stool softeners and decrease your fiber supplement.   c. Switching to mild anti-diarrheal medications (Kayopectate, Pepto  Bismol) can help.   d. If this worsens or does not improve, please call us.  6. Wash / shower every day.  You may shower over the dressings as they are waterproof.  Continue to shower over incision(s) after the dressing is off.  7. Remove your waterproof bandages 5 days after surgery.  You may leave the incision open to air.  You may replace a dressing/Band-Aid to cover the incision for comfort if you wish.   8. ACTIVITIES as tolerated:   a. You may resume regular (light) daily activities beginning the next day--such as daily self-care, walking, climbing stairs--gradually increasing activities as tolerated.  If you can walk 30 minutes without difficulty, it is safe to try more intense activity such as jogging, treadmill, bicycling, low-impact aerobics, swimming, etc. b. Save the most intensive and strenuous activity for last such as sit-ups, heavy lifting, contact sports, etc  Refrain from any heavy lifting or straining until you are off narcotics for pain control.   c. DO NOT PUSH THROUGH PAIN.  Let pain be your guide: If it hurts to do something, don't do it.  Pain is your body warning you to avoid that activity for another week until the pain goes down. d. You may drive when you are no longer taking prescription pain medication, you can comfortably wear a seatbelt, and you can safely maneuver your car and apply brakes. e. You may have sexual intercourse when it is comfortable.  9. FOLLOW UP in our office a. Please call [facility] at [phone] to set up an appointment to see your surgeon in the office for a follow-up appointment approximately 2-3 weeks after your surgery. b. Make sure that you call for this appointment the day you arrive home to insure a convenient appointment time.  10. IF YOU HAVE DISABILITY OR FAMILY LEAVE FORMS, BRING THEM TO THE OFFICE FOR PROCESSING.  DO NOT GIVE THEM TO YOUR DOCTOR.   WHEN TO CALL US [phone]: 1. Poor pain control 2. Reactions / problems with new  medications (rash/itching, nausea, etc)  3. Fever over 101.5 F (38.5 C) 4. Inability to urinate 5. Nausea and/or vomiting 6. Worsening swelling or bruising 7. Continued bleeding from incision. 8. Increased pain, redness, or drainage from the incision   The clinic staff is available to answer your questions during regular business hours (8:30am-5pm).  Please don't hesitate to call and ask to speak to one of our nurses for clinical concerns.   If you have a medical emergency, go to the nearest emergency room or call 911.  A surgeon from [facility] is always on call at the hospitals   [facility] [address], [address], [place], [place]  [address] ? MAIN: [phone] ? TOLL FREE: [phone] ?  FAX [phone] [email or web address]   

## 2018-10-26 LAB — CULTURE, BLOOD (ROUTINE X 2)
Culture: NO GROWTH
Culture: NO GROWTH
Special Requests: ADEQUATE
Special Requests: ADEQUATE

## 2018-11-23 ENCOUNTER — Ambulatory Visit: Payer: Medicare Other | Admitting: Physician Assistant

## 2019-03-29 DIAGNOSIS — Z20822 Contact with and (suspected) exposure to covid-19: Secondary | ICD-10-CM | POA: Diagnosis not present

## 2019-04-27 ENCOUNTER — Ambulatory Visit: Payer: Medicare Other | Attending: Internal Medicine

## 2019-04-27 DIAGNOSIS — Z23 Encounter for immunization: Secondary | ICD-10-CM | POA: Insufficient documentation

## 2019-04-27 NOTE — Progress Notes (Signed)
   Covid-19 Vaccination Clinic  Name:  Monique Castillo    MRN: [id number] DOB: 08/17/1945  04/27/2019  [name] was observed post Covid-19 immunization for 15 minutes without incident. She was provided with Vaccine Information Sheet and instruction to access the V-Safe system.   [name] was instructed to call 911 with any severe reactions post vaccine: Marland Kitchen Difficulty breathing  . Swelling of face and throat  . A fast heartbeat  . A bad rash all over body  . Dizziness and weakness   Immunizations Administered    Name Date Dose VIS Date Route   Pfizer COVID-19 Vaccine 04/27/2019 11:41 AM 0.3 mL 02/03/2019 Intramuscular   Manufacturer: [employer], [employer]   Lot: [id number]   NDC: [address]-3

## 2019-05-24 ENCOUNTER — Ambulatory Visit: Payer: Medicare Other | Attending: Internal Medicine

## 2019-05-24 DIAGNOSIS — Z23 Encounter for immunization: Secondary | ICD-10-CM

## 2019-05-24 NOTE — Progress Notes (Signed)
   Covid-19 Vaccination Clinic  Name:  Dula Havlik    MRN: [id number] DOB: April 06, 1945  05/24/2019  Ms. Koskela was observed post Covid-19 immunization for 15 minutes without incident. She was provided with Vaccine Information Sheet and instruction to access the V-Safe system.   Ms. Eppard was instructed to call 911 with any severe reactions post vaccine: Marland Kitchen Difficulty breathing  . Swelling of face and throat  . A fast heartbeat  . A bad rash all over body  . Dizziness and weakness   Immunizations Administered    Name Date Dose VIS Date Route   Pfizer COVID-19 Vaccine 05/24/2019 11:30 AM 0.3 mL 02/03/2019 Intramuscular   Manufacturer: [employer], [employer]   Lot: [id number]   NDC: [address]-3

## 2019-12-06 DIAGNOSIS — Z20822 Contact with and (suspected) exposure to covid-19: Secondary | ICD-10-CM | POA: Diagnosis not present

## 2019-12-19 DIAGNOSIS — Z23 Encounter for immunization: Secondary | ICD-10-CM | POA: Diagnosis not present

## 2020-06-30 IMAGING — CT CT ABDOMEN AND PELVIS WITH CONTRAST
2 of 5 series · 16 of 46 positions shown, 18 images · IV contrast (ISOVUE)
Comparison: Drain placement 05/12/2018. Preprocedure CT of
05/11/2018.

CLINICAL DATA: Follow-up of drain placement for ruptured appendix.

EXAM:
CT ABDOMEN AND PELVIS WITH CONTRAST
TECHNIQUE: Multidetector CT imaging of the abdomen and pelvis was performed
using the standard protocol following bolus administration of
intravenous contrast.
CONTRAST:  100mL OMNIPAQUE IOHEXOL 300 MG/ML  SOLN

[Series 2: axial st · axial · 0.75mm/px · z∈[-394,-4]mm · 13 of 90 slices shown, 15 images]
[im 6/90  soft-tissue]
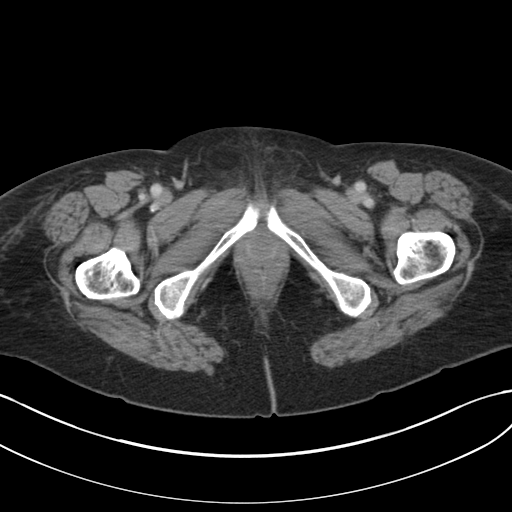
[im 6/90  bone]
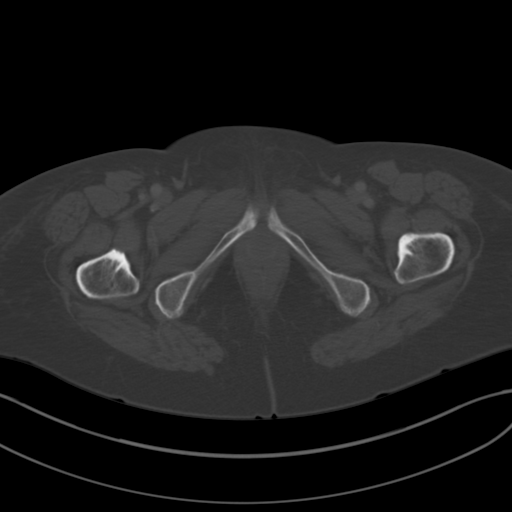
[im 12/90  soft-tissue]
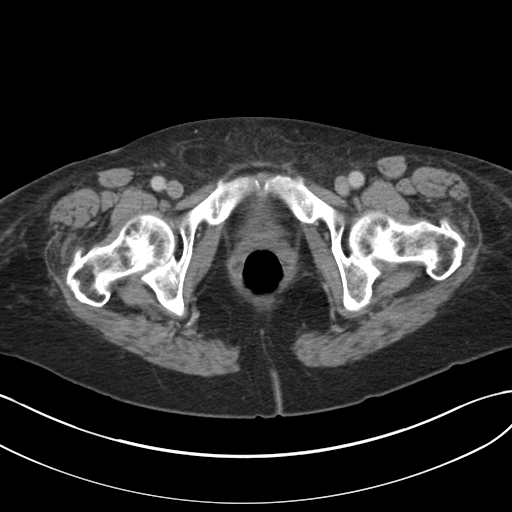
[im 18/90  soft-tissue]
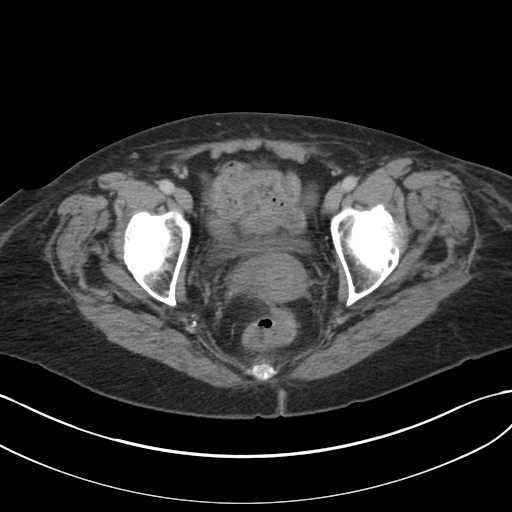
[im 24/90  soft-tissue]
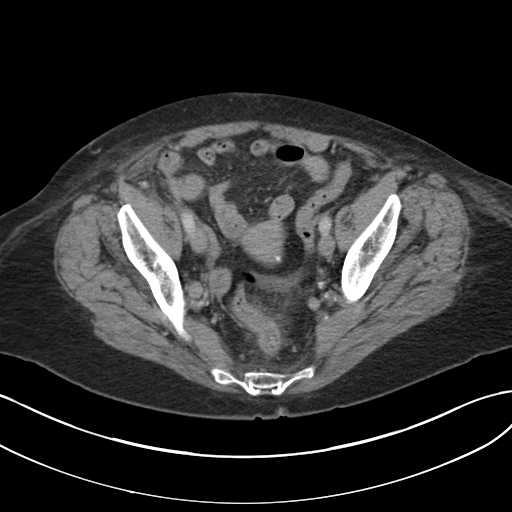
[im 30/90  soft-tissue]
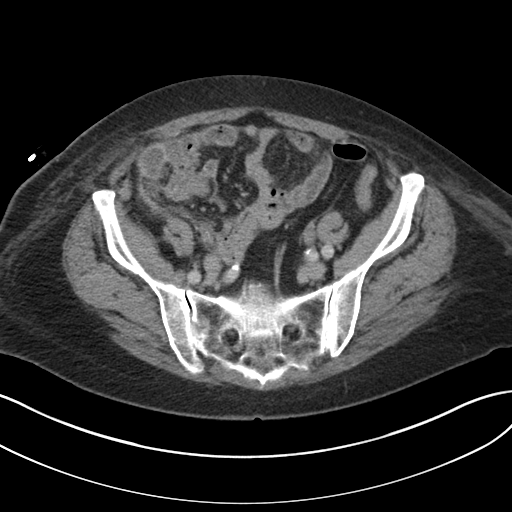
[im 36/90  soft-tissue]
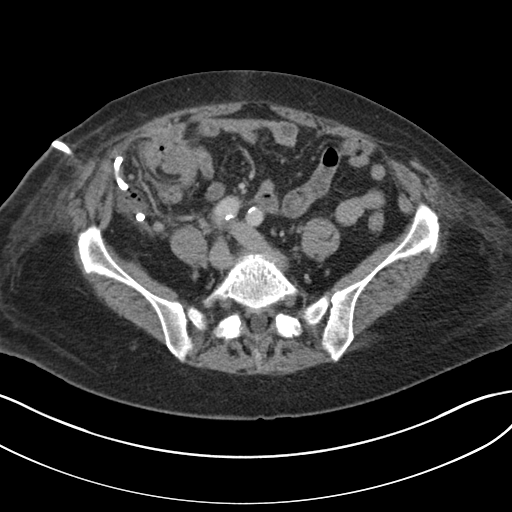
[im 48/90  soft-tissue]
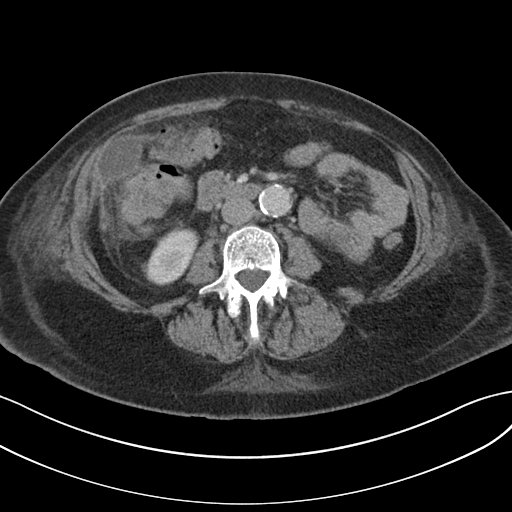
[im 54/90  soft-tissue]
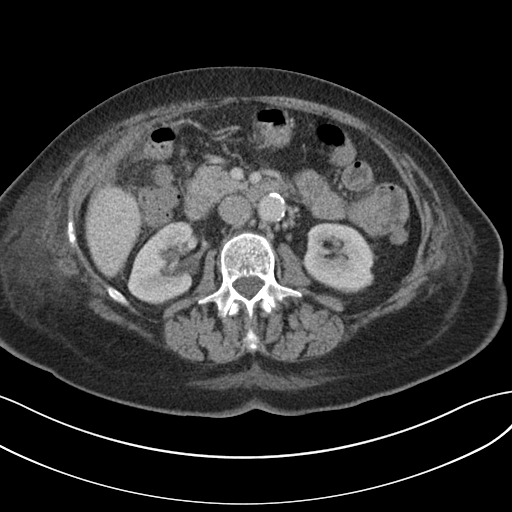
[im 60/90  soft-tissue]
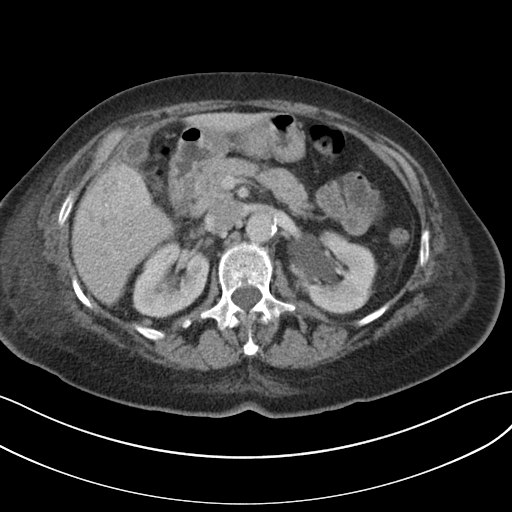
[im 60/90  bone]
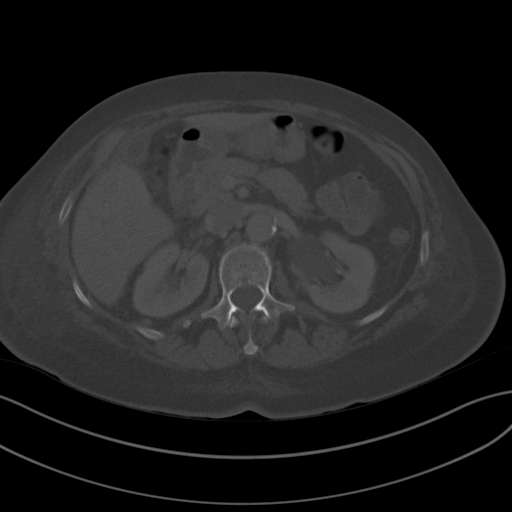
[im 66/90  soft-tissue]
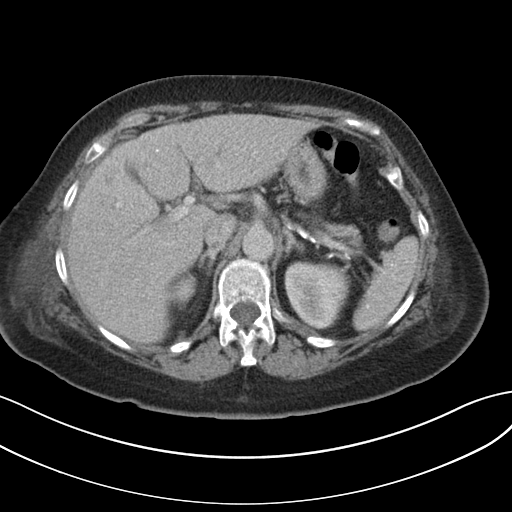
[im 72/90  soft-tissue]
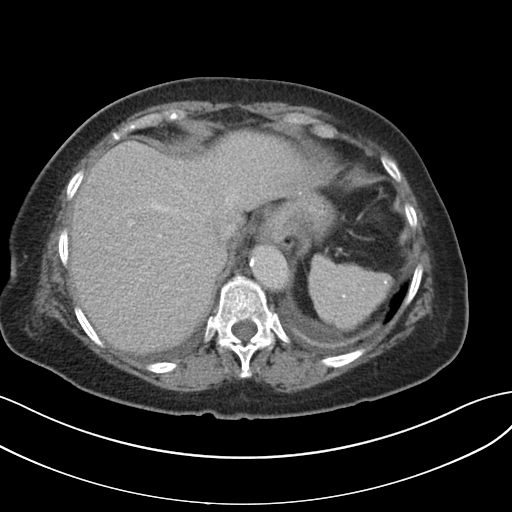
[im 78/90  soft-tissue]
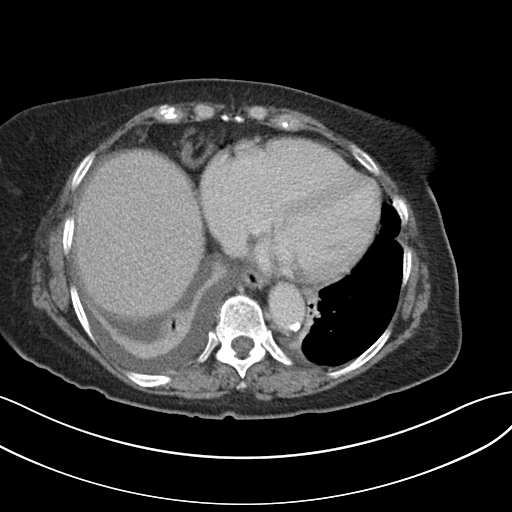
[im 84/90  soft-tissue]
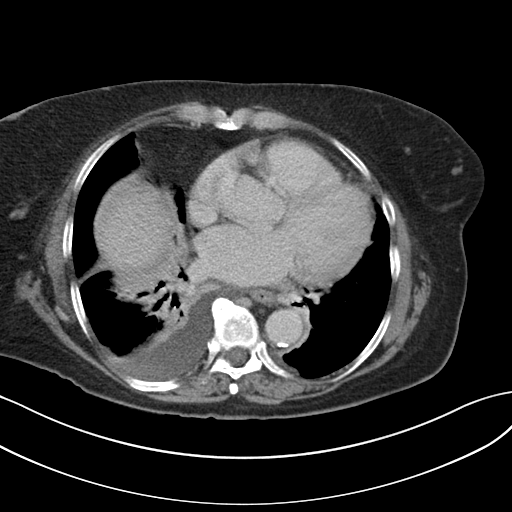

[Series 5: coronal st · coronal · 0.88mm/px · 3 of 131 slices shown]
[im 44/131  soft-tissue]
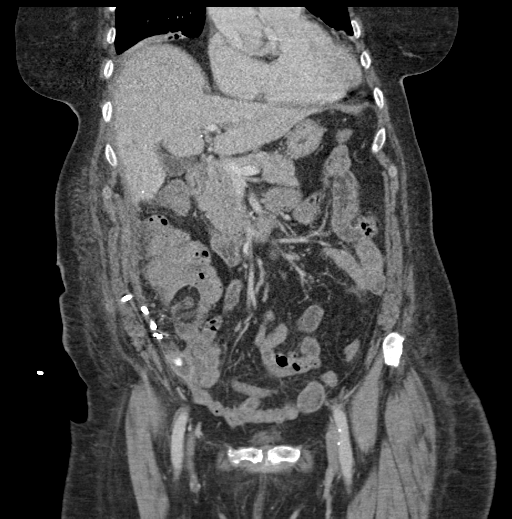
[im 58/131  soft-tissue]
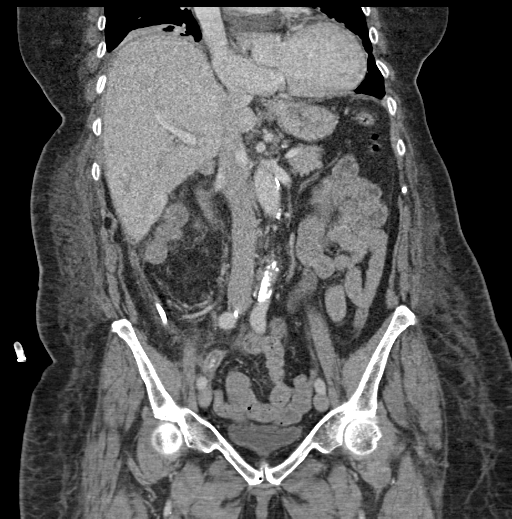
[im 73/131  soft-tissue]
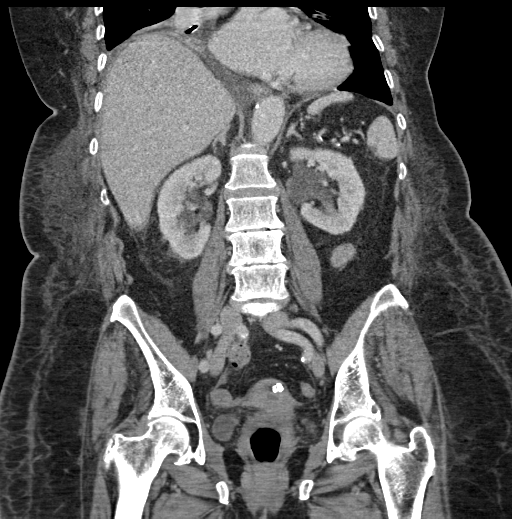

[16 of 46 positions shown; findings below may reference images not displayed]

FINDINGS: Lower chest: Right hemidiaphragm elevation. Similar right greater
than left base airspace disease with air bronchograms, favoring
atelectasis. Small right pleural effusion is new. Mild cardiomegaly,
without pericardial effusion.

Hepatobiliary: Old granulomatous disease in the liver. Normal
gallbladder, without biliary ductal dilatation.

Pancreas: Normal, without mass or ductal dilatation.

Spleen: Old granulomatous disease within.

Adrenals/Urinary Tract: Normal adrenal glands. Left greater than
right renal sinus cysts. No hydronephrosis. Normal urinary bladder.

Stomach/Bowel: Tiny hiatal hernia. Decompressed gastric body. Normal
colon and terminal ileum. Fluid-filled appendix with appendicoliths
within. Example at up to 11 mm on image 65/2. Periappendiceal edema,
slightly improved.

Normal small bowel.

Vascular/Lymphatic: Aortic and branch vessel atherosclerosis. No
abdominopelvic adenopathy.

Reproductive: Left uterine body 8 mm calcification likely represents
an underlying dystrophic fibroid. No adnexal mass.

Other: Trace cul-de-sac fluid is new.

A percutaneous drain has been placed in the previously described
upper right pelvic fluid collection. The posterior and inferior
portion of this collection measures 3.0 x 2.4 cm on image 51/2.
Compare 5.4 x 2.7 cm on the prior exam (when remeasured).

Contiguous more cephalad and anterior component measures 2.6 x
cm on image 43/2. Compare 3.1 x 4.4 cm on the prior exam.

medial to this, a somewhat less well-defined collection measures
x 3.2 cm on image 46/2. This measures on the order of 1.8 x 2.1 cm
on the prior.

Fat containing right inguinal hernia. Ventral abdominal wall laxity
also contains fat.

Musculoskeletal: Bilateral L5 pars defects. Trace L4-5
anterolisthesis.
IMPRESSION: 1. Interval placement of a periappendiceal drain. The posterior and
inferior component has decreased in size, as has a contiguous more
anterior and cephalad component. A more medial anterior ill-defined
fluid collection (possibly contiguous component) is increased
compared to 05/11/2018.
2. Persistent appendicoliths and appendiceal dilatation, with
decrease in periappendiceal edema.
3. New small right pleural effusion with persistent right greater
than left base volume loss/atelectasis.
4.  Aortic Atherosclerosis ([id number]).
